# Patient Record
Sex: Male | Born: 1985 | Race: White | Hispanic: No | Marital: Married | Smoking: Never smoker
Health system: Southern US, Community
[De-identification: ages and names within clinical notes are randomized; demographics above are authoritative.]

## PROBLEM LIST (undated history)

## (undated) DIAGNOSIS — N200 Calculus of kidney: Secondary | ICD-10-CM

## (undated) HISTORY — PX: APPENDECTOMY: SHX54

---

## 2020-02-21 ENCOUNTER — Other Ambulatory Visit: Payer: Self-pay

## 2020-02-21 ENCOUNTER — Encounter (HOSPITAL_COMMUNITY): Payer: Self-pay | Admitting: Emergency Medicine

## 2020-02-21 ENCOUNTER — Emergency Department (HOSPITAL_COMMUNITY)
Admission: EM | Admit: 2020-02-21 | Discharge: 2020-02-21 | Disposition: A | Discharge status: Home / Self Care | Payer: No Typology Code available for payment source | Source: Home / Self Care | Attending: Emergency Medicine | Admitting: Emergency Medicine

## 2020-02-21 ENCOUNTER — Emergency Department (HOSPITAL_COMMUNITY): Payer: No Typology Code available for payment source

## 2020-02-21 ENCOUNTER — Emergency Department (HOSPITAL_COMMUNITY)
Admission: EM | Admit: 2020-02-21 | Discharge: 2020-02-21 | Disposition: A | Discharge status: Home / Self Care | Payer: No Typology Code available for payment source | Attending: Emergency Medicine | Admitting: Emergency Medicine

## 2020-02-21 DIAGNOSIS — R1031 Right lower quadrant pain: Secondary | ICD-10-CM | POA: Diagnosis present

## 2020-02-21 DIAGNOSIS — R112 Nausea with vomiting, unspecified: Secondary | ICD-10-CM | POA: Insufficient documentation

## 2020-02-21 DIAGNOSIS — N23 Unspecified renal colic: Secondary | ICD-10-CM | POA: Insufficient documentation

## 2020-02-21 DIAGNOSIS — N201 Calculus of ureter: Secondary | ICD-10-CM

## 2020-02-21 LAB — COMPREHENSIVE METABOLIC PANEL
ALT: 49 U/L — ABNORMAL HIGH (ref 0–44)
AST: 33 U/L (ref 15–41)
Albumin: 4.4 g/dL (ref 3.5–5.0)
Alkaline Phosphatase: 81 U/L (ref 38–126)
Anion gap: 6 (ref 5–15)
BUN: 9 mg/dL (ref 6–20)
CO2: 25 mmol/L (ref 22–32)
Calcium: 9.1 mg/dL (ref 8.9–10.3)
Chloride: 108 mmol/L (ref 98–111)
Creatinine, Ser: 0.89 mg/dL (ref 0.61–1.24)
GFR calc Af Amer: >60 mL/min (ref >60)
GFR calc non Af Amer: >60 mL/min (ref >60)
Glucose, Bld: 105 mg/dL — ABNORMAL HIGH (ref 70–99)
Potassium: 4 mmol/L (ref 3.5–5.1)
Sodium: 139 mmol/L (ref 135–145)
Total Bilirubin: 0.9 mg/dL (ref 0.3–1.2)
Total Protein: 7.2 g/dL (ref 6.5–8.1)

## 2020-02-21 LAB — CBC WITH DIFFERENTIAL/PLATELET
Abs Immature Granulocytes: 0.02 10*3/uL (ref 0.00–0.07)
Basophils Absolute: 0 10*3/uL (ref 0.0–0.1)
Basophils Relative: 1 %
Eosinophils Absolute: 0.2 10*3/uL (ref 0.0–0.5)
Eosinophils Relative: 4 %
HCT: 44 % (ref 39.0–52.0)
Hemoglobin: 15.6 g/dL (ref 13.0–17.0)
Immature Granulocytes: 0 %
Lymphocytes Relative: 30 %
Lymphs Abs: 2 10*3/uL (ref 0.7–4.0)
MCH: 31.3 pg (ref 26.0–34.0)
MCHC: 35.5 g/dL (ref 30.0–36.0)
MCV: 88.4 fL (ref 80.0–100.0)
Monocytes Absolute: 0.6 10*3/uL (ref 0.1–1.0)
Monocytes Relative: 9 %
Neutro Abs: 3.7 10*3/uL (ref 1.7–7.7)
Neutrophils Relative %: 56 %
Platelets: 258 10*3/uL (ref 150–400)
RBC: 4.98 MIL/uL (ref 4.22–5.81)
RDW: 12.2 % (ref 11.5–15.5)
WBC: 6.5 10*3/uL (ref 4.0–10.5)
nRBC: 0 % (ref 0.0–0.2)

## 2020-02-21 LAB — URINALYSIS, ROUTINE W REFLEX MICROSCOPIC
Bacteria, UA: NONE SEEN
Bilirubin Urine: NEGATIVE
Glucose, UA: NEGATIVE mg/dL
Ketones, ur: NEGATIVE mg/dL
Leukocytes,Ua: NEGATIVE
Nitrite: NEGATIVE
Protein, ur: 30 mg/dL — AB
RBC / HPF: >50 RBC/hpf — ABNORMAL HIGH (ref 0–5)
Specific Gravity, Urine: 1.018 (ref 1.005–1.030)
pH: 6 (ref 5.0–8.0)

## 2020-02-21 MED ORDER — HYDROMORPHONE HCL 1 MG/ML IJ SOLN
1.0000 mg | Freq: Once | INTRAMUSCULAR | Status: AC
  Administered 2020-02-21: 16:00:00 1 mg via INTRAVENOUS
  Filled 2020-02-21: qty 1

## 2020-02-21 MED ORDER — SODIUM CHLORIDE 0.9 % IV BOLUS
1000.0000 mL | Freq: Once | INTRAVENOUS | Status: AC
  Administered 2020-02-21: 1000 mL via INTRAVENOUS

## 2020-02-21 MED ORDER — SODIUM CHLORIDE 0.9 % IV SOLN: INTRAVENOUS | Status: DC

## 2020-02-21 MED ORDER — ONDANSETRON 4 MG PO TBDP: 4.0000 mg | ORAL_TABLET | Freq: Three times a day (TID) | ORAL | 0 refills | Status: DC | PRN

## 2020-02-21 MED ORDER — HYDROCODONE-ACETAMINOPHEN 5-325 MG PO TABS: 1.0000 | ORAL_TABLET | ORAL | 0 refills | Status: DC | PRN

## 2020-02-21 MED ORDER — ONDANSETRON HCL 4 MG/2ML IJ SOLN
4.0000 mg | Freq: Once | INTRAMUSCULAR | Status: AC
  Administered 2020-02-21: 16:00:00 4 mg via INTRAVENOUS
  Filled 2020-02-21: qty 2

## 2020-02-21 MED ORDER — HYDROMORPHONE HCL 1 MG/ML IJ SOLN
1.0000 mg | Freq: Once | INTRAMUSCULAR | Status: AC
  Administered 2020-02-21: 19:00:00 1 mg via INTRAVENOUS
  Filled 2020-02-21: qty 1

## 2020-02-21 MED ORDER — ONDANSETRON HCL 4 MG/2ML IJ SOLN
4.0000 mg | Freq: Once | INTRAMUSCULAR | Status: AC
  Administered 2020-02-21: 4 mg via INTRAVENOUS
  Filled 2020-02-21: qty 2

## 2020-02-21 MED ORDER — ONDANSETRON HCL 4 MG/2ML IJ SOLN
4.0000 mg | Freq: Once | INTRAMUSCULAR | Status: DC
  Filled 2020-02-21: qty 2

## 2020-02-21 MED ORDER — KETOROLAC TROMETHAMINE 15 MG/ML IJ SOLN
15.0000 mg | Freq: Once | INTRAMUSCULAR | Status: AC
  Administered 2020-02-21: 15 mg via INTRAVENOUS
  Filled 2020-02-21: qty 1

## 2020-02-21 MED ORDER — HYDROMORPHONE HCL 1 MG/ML IJ SOLN
1.0000 mg | Freq: Once | INTRAMUSCULAR | Status: AC
  Administered 2020-02-21: 08:00:00 1 mg via INTRAVENOUS
  Filled 2020-02-21: qty 1

## 2020-02-21 NOTE — Discharge Instructions (Addendum)
If you develop fever, uncontrolled vomiting, uncontrolled pain, or any other new/concerning symptoms then return to the ER for evaluation.   Take ibuprofen for your pain.  You may also take Tylenol.  If the pain is still uncontrolled, then use the hydrocodone for pain.  Do not drive or operate heavy machinery while taking this medication.

## 2020-02-21 NOTE — ED Triage Notes (Signed)
Return d/t to increasing emesis.  Pain to right flank and radiating to front.  C/o back pain when urinating.

## 2020-02-21 NOTE — ED Notes (Signed)
Pt wants to hold off on Zofran for now.

## 2020-02-21 NOTE — ED Provider Notes (Signed)
[facility] EMERGENCY DEPARTMENT Provider Note   CSN: [id number] Arrival date & time: 02/21/20  1514     History Chief Complaint  Patient presents with  . Emesis    Roger Thompson is a 34 y.o. male.  Patient seen this morning for acute onset of right flank pain.  Patient's clinical history very consistent with kidney stone.  Patient opted not to have CT scan.  But did have labs done.  Patient discharged home on Zofran and hydrocodone.  But patient with persistent vomiting and worsening pain so came back.  Remote history of kidney stone.  No vomiting of blood.  No fevers.  Review of labs from this morning patient's renal function was normal.  Urinalysis had red blood cells in it.  Urinalysis had a few white blood cells.  But no bacteria.  CBC without a leukocytosis.        History reviewed. No pertinent past medical history.  There are no problems to display for this patient.   Past Surgical History:  Procedure Laterality Date  . APPENDECTOMY         History reviewed. No pertinent family history.  Social History   Tobacco Use  . Smoking status: Never Smoker  . Smokeless tobacco: Never Used  Substance Use Topics  . Alcohol use: Never  . Drug use: Never    Home Medications Prior to Admission medications   Medication Sig Start Date End Date Taking? Authorizing Provider  diphenhydrAMINE (BENADRYL) 25 mg capsule Take 25 mg by mouth every 6 (six) hours as needed for allergies.   Yes [provider]  levocetirizine (XYZAL) 5 MG tablet Take 5 mg by mouth every evening.   Yes [provider]  HYDROcodone-acetaminophen (NORCO) 5-325 MG tablet Take 1 tablet by mouth every 4 (four) hours as needed for severe pain. 02/21/20   Pricilla Loveless, MD  ondansetron (ZOFRAN ODT) 4 MG disintegrating tablet Take 1 tablet (4 mg total) by mouth every 8 (eight) hours as needed for nausea or vomiting. 02/21/20   Pricilla Loveless, MD    Allergies    Patient has no known  allergies.  Review of Systems   Review of Systems  Constitutional: Negative for chills and fever.  HENT: Negative for congestion, rhinorrhea and sore throat.   Eyes: Negative for visual disturbance.  Respiratory: Negative for cough and shortness of breath.   Cardiovascular: Negative for chest pain and leg swelling.  Gastrointestinal: Positive for nausea and vomiting. Negative for abdominal pain and diarrhea.  Genitourinary: Positive for flank pain and hematuria. Negative for dysuria.  Musculoskeletal: Negative for back pain and neck pain.  Skin: Negative for rash.  Neurological: Negative for dizziness, light-headedness and headaches.  Hematological: Does not bruise/bleed easily.  Psychiatric/Behavioral: Negative for confusion.    Physical Exam Updated Vital Signs BP 132/70   Pulse 70   Temp 98.2 F (36.8 C) (Oral)   Resp 16   Ht 1.88 m (6\' 2" )   Wt 111.1 kg   SpO2 98%   BMI 31.46 kg/m   Physical Exam Vitals and nursing note reviewed.  Constitutional:      Appearance: He is well-developed.  HENT:     Head: Normocephalic and atraumatic.  Eyes:     Extraocular Movements: Extraocular movements intact.     Conjunctiva/sclera: Conjunctivae normal.     Pupils: Pupils are equal, round, and reactive to light.  Cardiovascular:     Rate and Rhythm: Normal rate and regular rhythm.     Pulses:  Normal pulses.     Heart sounds: Normal heart sounds. No murmur.  Pulmonary:     Effort: Pulmonary effort is normal. No respiratory distress.     Breath sounds: Normal breath sounds.  Abdominal:     General: There is no distension.     Palpations: Abdomen is soft.     Tenderness: There is no abdominal tenderness.  Musculoskeletal:        General: Normal range of motion.     Cervical back: Neck supple.  Skin:    General: Skin is warm and dry.     Capillary Refill: Capillary refill takes less than 2 seconds.  Neurological:     General: No focal deficit present.     Mental Status: He  is alert and oriented to person, place, and time.     ED Results / Procedures / Treatments   Labs (all labs ordered are listed, but only abnormal results are displayed) Labs Reviewed - No data to display  EKG None  Radiology CT Renal Stone Study  Result Date: 02/21/2020 CLINICAL DATA:  Right-sided flank pain EXAM: CT ABDOMEN AND PELVIS WITHOUT CONTRAST TECHNIQUE: Multidetector CT imaging of the abdomen and pelvis was performed following the standard protocol without IV contrast. COMPARISON:  None. FINDINGS: Lower chest: Lung bases demonstrate some mild dependent atelectatic changes. No focal infiltrate or effusion is seen. Hepatobiliary: No focal liver abnormality is seen. No gallstones, gallbladder wall thickening, or biliary dilatation. Pancreas: Unremarkable. No pancreatic ductal dilatation or surrounding inflammatory changes. Spleen: Normal in size without focal abnormality. Adrenals/Urinary Tract: Adrenal glands are within normal limits bilaterally. The left kidney is well visualize without renal calculi. No obstructive changes are noted. The right kidney demonstrates no calculi although moderate hydronephrosis is noted secondary to a distal right ureteral stone just proximal to the UVJ. This measures approximately 2 mm in dimension and is best visualized on image number 84 of series 2. The bladder is well distended. Stomach/Bowel: Changes consistent with prior appendectomy are noted. No obstructive or inflammatory changes of the large or small bowel are seen. The stomach is within normal limits. Vascular/Lymphatic: No significant vascular findings are present. No enlarged abdominal or pelvic lymph nodes. Reproductive: Prostate is unremarkable. Other: No abdominal wall hernia or abnormality. No abdominopelvic ascites. Musculoskeletal: No acute or significant osseous findings. IMPRESSION: Distal right ureteral stone measuring 2 mm with moderate to severe hydronephrosis. Electronically Signed   By:  Inez Catalina M.D.   On: 02/21/2020 17:22   Results for orders placed or performed during the hospital encounter of 02/21/20  Comprehensive metabolic panel  Result Value Ref Range   Sodium 139 135 - 145 mmol/L   Potassium 4.0 3.5 - 5.1 mmol/L   Chloride 108 98 - 111 mmol/L   CO2 25 22 - 32 mmol/L   Glucose, Bld 105 (H) 70 - 99 mg/dL   BUN 9 6 - 20 mg/dL   Creatinine, Ser 0.89 0.61 - 1.24 mg/dL   Calcium 9.1 8.9 - 10.3 mg/dL   Total Protein 7.2 6.5 - 8.1 g/dL   Albumin 4.4 3.5 - 5.0 g/dL   AST 33 15 - 41 U/L   ALT 49 (H) 0 - 44 U/L   Alkaline Phosphatase 81 38 - 126 U/L   Total Bilirubin 0.9 0.3 - 1.2 mg/dL   GFR calc non Af Amer >60 >60 mL/min   GFR calc Af Amer >60 >60 mL/min   Anion gap 6 5 - 15  Urinalysis, Routine w reflex  microscopic  Result Value Ref Range   Color, Urine YELLOW YELLOW   APPearance HAZY (A) CLEAR   Specific Gravity, Urine 1.018 1.005 - 1.030   pH 6.0 5.0 - 8.0   Glucose, UA NEGATIVE NEGATIVE mg/dL   Hgb urine dipstick LARGE (A) NEGATIVE   Bilirubin Urine NEGATIVE NEGATIVE   Ketones, ur NEGATIVE NEGATIVE mg/dL   Protein, ur 30 (A) NEGATIVE mg/dL   Nitrite NEGATIVE NEGATIVE   Leukocytes,Ua NEGATIVE NEGATIVE   RBC / HPF >50 (H) 0 - 5 RBC/hpf   WBC, UA 11-20 0 - 5 WBC/hpf   Bacteria, UA NONE SEEN NONE SEEN   Mucus PRESENT    Uric Acid Crys, UA PRESENT   CBC with Differential  Result Value Ref Range   WBC 6.5 4.0 - 10.5 K/uL   RBC 4.98 4.22 - 5.81 MIL/uL   Hemoglobin 15.6 13.0 - 17.0 g/dL   HCT 74.2 59.5 - 63.8 %   MCV 88.4 80.0 - 100.0 fL   MCH 31.3 26.0 - 34.0 pg   MCHC 35.5 30.0 - 36.0 g/dL   RDW 75.6 43.3 - 29.5 %   Platelets 258 150 - 400 K/uL   nRBC 0.0 0.0 - 0.2 %   Neutrophils Relative % 56 %   Neutro Abs 3.7 1.7 - 7.7 K/uL   Lymphocytes Relative 30 %   Lymphs Abs 2.0 0.7 - 4.0 K/uL   Monocytes Relative 9 %   Monocytes Absolute 0.6 0.1 - 1.0 K/uL   Eosinophils Relative 4 %   Eosinophils Absolute 0.2 0.0 - 0.5 K/uL   Basophils  Relative 1 %   Basophils Absolute 0.0 0.0 - 0.1 K/uL   Immature Granulocytes 0 %   Abs Immature Granulocytes 0.02 0.00 - 0.07 K/uL    Procedures Procedures (including critical care time)  Medications Ordered in ED Medications  0.9 %  sodium chloride infusion (has no administration in time range)  HYDROmorphone (DILAUDID) injection 1 mg (has no administration in time range)  ondansetron (ZOFRAN) injection 4 mg (has no administration in time range)  sodium chloride 0.9 % bolus 1,000 mL (1,000 mLs Intravenous New Bag/Given 02/21/20 1612)  HYDROmorphone (DILAUDID) injection 1 mg (1 mg Intravenous Given 02/21/20 1611)  ondansetron (ZOFRAN) injection 4 mg (4 mg Intravenous Given 02/21/20 1611)    ED Course  I have reviewed the triage vital signs and the nursing notes.  Pertinent labs & imaging results that were available during my care of the patient were reviewed by me and considered in my medical decision making (see chart for details).    MDM Rules/Calculators/A&P                     Patient improved here with IV hydromorphone and Zofran.  CT scan can for kidney stone showed a 2 mm right ureteral stone close to the bladder junction.  Patient's pain improved.  Should pass the stone overnight.  Referral to urology given.  Patient already has prescriptions for pain medicine and antinausea medicine.  Patient overall improved.  Final Clinical Impression(s) / ED Diagnoses Final diagnoses:  Right ureteral stone    Rx / DC Orders ED Discharge Orders    None       Vanetta Mulders, MD 02/21/20 [phone]

## 2020-02-21 NOTE — ED Triage Notes (Signed)
C/o right flank pain, rating pain 06/24/09.  Has passed stones in the passed.  C/o nausea and vomiting.

## 2020-02-21 NOTE — ED Provider Notes (Signed)
[facility] EMERGENCY DEPARTMENT Provider Note   CSN: [id number] Arrival date & time: 02/21/20  0747     History Chief Complaint  Patient presents with  . Flank Pain    right    Roger Thompson is a 34 y.o. male.  HPI 34 year old male presents with right-sided flank pain and an episode of vomiting.  Started all of a sudden this morning.  Seem to go away but then came back so he came to the ER for evaluation.  Pain is more moderate right now.  He has not taken anything for the pain.  It seems to shoot to his right side of his abdomen.  He had a kidney stone many years ago that was evaluated in [place] with a CT scan and states he was able to pass this on his own.  This feels similar.  He has not had any fevers.  He has not urinated yet today but previously had no dysuria or hematuria.  History reviewed. No pertinent past medical history.  There are no problems to display for this patient.   Past Surgical History:  Procedure Laterality Date  . APPENDECTOMY         No family history on file.  Social History   Tobacco Use  . Smoking status: Never Smoker  . Smokeless tobacco: Never Used  Substance Use Topics  . Alcohol use: Never  . Drug use: Never    Home Medications Prior to Admission medications   Medication Sig Start Date End Date Taking? Authorizing Provider  diphenhydrAMINE (BENADRYL) 25 mg capsule Take 25 mg by mouth every 6 (six) hours as needed for allergies.   Yes [provider]  levocetirizine (XYZAL) 5 MG tablet Take 5 mg by mouth every evening.   Yes [provider]  HYDROcodone-acetaminophen (NORCO) 5-325 MG tablet Take 1 tablet by mouth every 4 (four) hours as needed for severe pain. 02/21/20   Sherwood Gambler, MD    Allergies    Patient has no known allergies.  Review of Systems   Review of Systems  Constitutional: Negative for fever.  Gastrointestinal: Positive for abdominal pain, nausea and vomiting.  Genitourinary: Positive  for flank pain. Negative for dysuria and hematuria.  All other systems reviewed and are negative.   Physical Exam Updated Vital Signs BP 114/66   Pulse 63   Temp 98.4 F (36.9 C) (Oral)   Resp 16   Ht 6\' 2"  (1.88 m)   Wt 111.1 kg   SpO2 95%   BMI 31.46 kg/m   Physical Exam Vitals and nursing note reviewed.  Constitutional:      General: He is not in acute distress.    Appearance: He is well-developed. He is not ill-appearing or diaphoretic.  HENT:     Head: Normocephalic and atraumatic.     Right Ear: External ear normal.     Left Ear: External ear normal.     Nose: Nose normal.  Eyes:     General:        Right eye: No discharge.        Left eye: No discharge.  Cardiovascular:     Rate and Rhythm: Normal rate and regular rhythm.     Heart sounds: Normal heart sounds.  Pulmonary:     Effort: Pulmonary effort is normal.     Breath sounds: Normal breath sounds.  Abdominal:     General: There is no distension.     Palpations: Abdomen is soft.  Tenderness: There is no abdominal tenderness. There is right CVA tenderness (mild).  Musculoskeletal:     Cervical back: Neck supple.  Skin:    General: Skin is warm and dry.  Neurological:     Mental Status: He is alert.  Psychiatric:        Mood and Affect: Mood is not anxious.     ED Results / Procedures / Treatments   Labs (all labs ordered are listed, but only abnormal results are displayed) Labs Reviewed  COMPREHENSIVE METABOLIC PANEL - Abnormal; Notable for the following components:      Result Value   Glucose, Bld 105 (*)    ALT 49 (*)    All other components within normal limits  URINALYSIS, ROUTINE W REFLEX MICROSCOPIC - Abnormal; Notable for the following components:   APPearance HAZY (*)    Hgb urine dipstick LARGE (*)    Protein, ur 30 (*)    RBC / HPF >50 (*)    All other components within normal limits  CBC WITH DIFFERENTIAL/PLATELET    EKG None  Radiology No results  found.  Procedures Procedures (including critical care time)  Medications Ordered in ED Medications  ondansetron (ZOFRAN) injection 4 mg (4 mg Intravenous Refused 02/21/20 1028)  sodium chloride 0.9 % bolus 1,000 mL (0 mLs Intravenous Stopped 02/21/20 0933)  ketorolac (TORADOL) 15 MG/ML injection 15 mg (15 mg Intravenous Given 02/21/20 0823)  HYDROmorphone (DILAUDID) injection 1 mg (1 mg Intravenous Given 02/21/20 0825)    ED Course  I have reviewed the triage vital signs and the nursing notes.  Pertinent labs & imaging results that were available during my care of the patient were reviewed by me and considered in my medical decision making (see chart for details).    MDM Rules/Calculators/A&P                      Presentation is consistent with ureteral colic.  However his pain is quite well controlled and his labs which have been reviewed are unrevealing except for hematuria.  My suspicion that he has a ureteral stone is pretty high.  He is already had his appendix out.  Highly doubt acute emergent abdominal condition.  No abdominal tenderness.  At this point, I had shared decision making decision with patient and we decided to hold off on CT given highly likely diagnosis of stone and since his pain is controlled he is probably likely to pass it.  We discussed we can get it later if his symptoms worsen.  We will have him follow-up with urology.  Will give pain control. Final Clinical Impression(s) / ED Diagnoses Final diagnoses:  Ureteral colic    Rx / DC Orders ED Discharge Orders         Ordered    HYDROcodone-acetaminophen (NORCO) 5-325 MG tablet  Every 4 hours PRN     02/21/20 1023           Pricilla Loveless, MD 02/21/20 1044

## 2020-02-21 NOTE — Discharge Instructions (Addendum)
CT scan shows evidence of a distal ureteral stone about ready to enter the bladder from the right ureter.  Should pass it sometime tonight.  Make an appointment follow-up with urology.  Return for any new or worse symptoms.  Take the Zofran dissolvable help prevent vomiting.  Take your pain medication as needed.

## 2020-02-26 ENCOUNTER — Encounter (HOSPITAL_COMMUNITY)
Admission: RE | Disposition: A | Discharge status: Home / Self Care | Payer: Self-pay | Source: Ambulatory Visit | Attending: Urology

## 2020-02-26 ENCOUNTER — Other Ambulatory Visit: Payer: Self-pay

## 2020-02-26 ENCOUNTER — Ambulatory Visit (HOSPITAL_COMMUNITY): Payer: No Typology Code available for payment source | Admitting: Anesthesiology

## 2020-02-26 ENCOUNTER — Ambulatory Visit (HOSPITAL_COMMUNITY): Payer: No Typology Code available for payment source

## 2020-02-26 ENCOUNTER — Encounter (HOSPITAL_COMMUNITY): Payer: Self-pay | Admitting: *Deleted

## 2020-02-26 ENCOUNTER — Ambulatory Visit (HOSPITAL_COMMUNITY)
Admission: RE | Admit: 2020-02-26 | Discharge: 2020-02-26 | Disposition: A | Discharge status: Home / Self Care | Payer: No Typology Code available for payment source | Source: Ambulatory Visit | Attending: Urology | Admitting: Urology

## 2020-02-26 ENCOUNTER — Emergency Department (HOSPITAL_COMMUNITY)
Admission: EM | Admit: 2020-02-26 | Discharge: 2020-02-26 | Disposition: A | Discharge status: Home / Self Care | Payer: No Typology Code available for payment source | Source: Home / Self Care | Attending: Emergency Medicine | Admitting: Emergency Medicine

## 2020-02-26 ENCOUNTER — Encounter (HOSPITAL_COMMUNITY): Payer: Self-pay | Admitting: Urology

## 2020-02-26 DIAGNOSIS — Z791 Long term (current) use of non-steroidal anti-inflammatories (NSAID): Secondary | ICD-10-CM | POA: Insufficient documentation

## 2020-02-26 DIAGNOSIS — N201 Calculus of ureter: Secondary | ICD-10-CM | POA: Insufficient documentation

## 2020-02-26 DIAGNOSIS — Q6210 Congenital occlusion of ureter, unspecified: Secondary | ICD-10-CM | POA: Insufficient documentation

## 2020-02-26 DIAGNOSIS — Z79899 Other long term (current) drug therapy: Secondary | ICD-10-CM | POA: Insufficient documentation

## 2020-02-26 DIAGNOSIS — Z20822 Contact with and (suspected) exposure to covid-19: Secondary | ICD-10-CM | POA: Diagnosis not present

## 2020-02-26 DIAGNOSIS — N23 Unspecified renal colic: Secondary | ICD-10-CM | POA: Insufficient documentation

## 2020-02-26 HISTORY — PX: CYSTOSCOPY WITH RETROGRADE PYELOGRAM, URETEROSCOPY AND STENT PLACEMENT: SHX5789

## 2020-02-26 HISTORY — DX: Calculus of kidney: N20.0

## 2020-02-26 LAB — RESPIRATORY PANEL BY RT PCR (FLU A&B, COVID)
Influenza A by PCR: NEGATIVE
Influenza B by PCR: NEGATIVE
SARS Coronavirus 2 by RT PCR: NEGATIVE

## 2020-02-26 SURGERY — CYSTOURETEROSCOPY, WITH RETROGRADE PYELOGRAM AND STENT INSERTION
Anesthesia: General | Site: Ureter | Laterality: Right

## 2020-02-26 MED ORDER — SODIUM CHLORIDE 0.9 % IR SOLN
Status: DC | PRN
  Administered 2020-02-26: 3000 mL

## 2020-02-26 MED ORDER — HYDROMORPHONE HCL 1 MG/ML IJ SOLN
1.0000 mg | Freq: Once | INTRAMUSCULAR | Status: AC
  Administered 2020-02-26: 04:00:00 1 mg via INTRAVENOUS
  Filled 2020-02-26: qty 1

## 2020-02-26 MED ORDER — SODIUM CHLORIDE 0.9% FLUSH: 3.0000 mL | Freq: Two times a day (BID) | INTRAVENOUS | Status: DC

## 2020-02-26 MED ORDER — FENTANYL CITRATE (PF) 100 MCG/2ML IJ SOLN: 25.0000 ug | INTRAMUSCULAR | Status: DC | PRN

## 2020-02-26 MED ORDER — MIDAZOLAM HCL 2 MG/2ML IJ SOLN
INTRAMUSCULAR | Status: AC
  Filled 2020-02-26: qty 2

## 2020-02-26 MED ORDER — KETOROLAC TROMETHAMINE 30 MG/ML IJ SOLN
30.0000 mg | Freq: Once | INTRAMUSCULAR | Status: AC
  Administered 2020-02-26: 03:00:00 30 mg via INTRAVENOUS
  Filled 2020-02-26: qty 1

## 2020-02-26 MED ORDER — PHENAZOPYRIDINE HCL 200 MG PO TABS: 200.0000 mg | ORAL_TABLET | Freq: Three times a day (TID) | ORAL | 1 refills | Status: DC | PRN

## 2020-02-26 MED ORDER — ONDANSETRON HCL 4 MG/2ML IJ SOLN
4.0000 mg | INTRAMUSCULAR | Status: AC
  Administered 2020-02-26: 4 mg via INTRAVENOUS

## 2020-02-26 MED ORDER — KETOROLAC TROMETHAMINE 30 MG/ML IJ SOLN
INTRAMUSCULAR | Status: AC
  Filled 2020-02-26: qty 1

## 2020-02-26 MED ORDER — LIDOCAINE 2% (20 MG/ML) 5 ML SYRINGE
INTRAMUSCULAR | Status: AC
  Filled 2020-02-26: qty 5

## 2020-02-26 MED ORDER — KETOROLAC TROMETHAMINE 30 MG/ML IJ SOLN
30.0000 mg | Freq: Once | INTRAMUSCULAR | Status: AC | PRN
  Administered 2020-02-26: 21:00:00 30 mg via INTRAVENOUS

## 2020-02-26 MED ORDER — MIDAZOLAM HCL 2 MG/2ML IJ SOLN
INTRAMUSCULAR | Status: DC | PRN
  Administered 2020-02-26: 2 mg via INTRAVENOUS

## 2020-02-26 MED ORDER — ONDANSETRON HCL 4 MG/2ML IJ SOLN
INTRAMUSCULAR | Status: DC | PRN
  Administered 2020-02-26: 4 mg via INTRAVENOUS

## 2020-02-26 MED ORDER — FENTANYL CITRATE (PF) 100 MCG/2ML IJ SOLN
INTRAMUSCULAR | Status: AC
  Filled 2020-02-26: qty 2

## 2020-02-26 MED ORDER — LIDOCAINE 2% (20 MG/ML) 5 ML SYRINGE
INTRAMUSCULAR | Status: DC | PRN
  Administered 2020-02-26: 100 mg via INTRAVENOUS

## 2020-02-26 MED ORDER — PROPOFOL 10 MG/ML IV BOLUS
INTRAVENOUS | Status: AC
  Filled 2020-02-26: qty 20

## 2020-02-26 MED ORDER — OXYCODONE-ACETAMINOPHEN 5-325 MG PO TABS: 1.0000 | ORAL_TABLET | ORAL | 0 refills | Status: DC | PRN

## 2020-02-26 MED ORDER — OXYCODONE-ACETAMINOPHEN 5-325 MG PO TABS: 2.0000 | ORAL_TABLET | ORAL | 0 refills | Status: DC | PRN

## 2020-02-26 MED ORDER — CEFAZOLIN (ANCEF) 1 G IV SOLR
2.0000 g | INTRAVENOUS | Status: DC
  Filled 2020-02-26: qty 2

## 2020-02-26 MED ORDER — HYDROMORPHONE HCL 1 MG/ML IJ SOLN
1.0000 mg | Freq: Once | INTRAMUSCULAR | Status: AC
  Administered 2020-02-26: 03:00:00 1 mg via INTRAVENOUS
  Filled 2020-02-26: qty 1

## 2020-02-26 MED ORDER — ONDANSETRON HCL 4 MG/2ML IJ SOLN
4.0000 mg | Freq: Once | INTRAMUSCULAR | Status: AC
  Administered 2020-02-26: 02:00:00 4 mg via INTRAVENOUS
  Filled 2020-02-26: qty 2

## 2020-02-26 MED ORDER — OXYCODONE HCL 5 MG PO TABS: 5.0000 mg | ORAL_TABLET | Freq: Once | ORAL | Status: DC | PRN

## 2020-02-26 MED ORDER — DEXAMETHASONE SODIUM PHOSPHATE 10 MG/ML IJ SOLN
INTRAMUSCULAR | Status: DC | PRN
  Administered 2020-02-26: 10 mg via INTRAVENOUS

## 2020-02-26 MED ORDER — CEFAZOLIN SODIUM-DEXTROSE 2-4 GM/100ML-% IV SOLN
2.0000 g | Freq: Once | INTRAVENOUS | Status: AC
  Administered 2020-02-26: 19:00:00 2 g via INTRAVENOUS
  Filled 2020-02-26: qty 100

## 2020-02-26 MED ORDER — FENTANYL CITRATE (PF) 100 MCG/2ML IJ SOLN
INTRAMUSCULAR | Status: DC | PRN
  Administered 2020-02-26: 50 ug via INTRAVENOUS
  Administered 2020-02-26: 100 ug via INTRAVENOUS
  Administered 2020-02-26: 50 ug via INTRAVENOUS

## 2020-02-26 MED ORDER — SODIUM CHLORIDE 0.9 % IV SOLN
INTRAVENOUS | Status: DC | PRN
  Administered 2020-02-26: 2 mL

## 2020-02-26 MED ORDER — LACTATED RINGERS IV SOLN: INTRAVENOUS | Status: DC

## 2020-02-26 MED ORDER — OXYCODONE HCL 5 MG/5ML PO SOLN: 5.0000 mg | Freq: Once | ORAL | Status: DC | PRN

## 2020-02-26 MED ORDER — PROPOFOL 10 MG/ML IV BOLUS
INTRAVENOUS | Status: DC | PRN
  Administered 2020-02-26: 200 mg via INTRAVENOUS

## 2020-02-26 MED ORDER — ONDANSETRON HCL 4 MG/2ML IJ SOLN
INTRAMUSCULAR | Status: AC
  Filled 2020-02-26: qty 2

## 2020-02-26 SURGICAL SUPPLY — 24 items
BAG URO CATCHER STRL LF (MISCELLANEOUS) ×3 IMPLANT
BASKET STONE NCOMPASS (UROLOGICAL SUPPLIES) IMPLANT
CATH URET 5FR 28IN OPEN ENDED (CATHETERS) IMPLANT
CATH URET DUAL LUMEN 6-10FR 50 (CATHETERS) IMPLANT
CLOTH BEACON ORANGE TIMEOUT ST (SAFETY) ×3 IMPLANT
EXTRACTOR STONE NITINOL NGAGE (UROLOGICAL SUPPLIES) ×3 IMPLANT
FIBER LASER FLEXIVA 1000 (UROLOGICAL SUPPLIES) IMPLANT
FIBER LASER FLEXIVA 365 (UROLOGICAL SUPPLIES) IMPLANT
FIBER LASER FLEXIVA 550 (UROLOGICAL SUPPLIES) IMPLANT
FIBER LASER TRAC TIP (UROLOGICAL SUPPLIES) IMPLANT
GLOVE SURG SS PI 8.0 STRL IVOR (GLOVE) ×3 IMPLANT
GOWN STRL REUS W/TWL XL LVL3 (GOWN DISPOSABLE) ×3 IMPLANT
GUIDEWIRE STR DUAL SENSOR (WIRE) ×3 IMPLANT
IV NS IRRIG 3000ML ARTHROMATIC (IV SOLUTION) ×3 IMPLANT
KIT BALLN UROMAX 15FX4 (MISCELLANEOUS) ×1 IMPLANT
KIT BALLN UROMAX 26 75X4 (MISCELLANEOUS) ×2
KIT TURNOVER KIT A (KITS) IMPLANT
MANIFOLD NEPTUNE II (INSTRUMENTS) ×3 IMPLANT
PACK CYSTO (CUSTOM PROCEDURE TRAY) ×3 IMPLANT
SHEATH URETERAL 12FRX35CM (MISCELLANEOUS) IMPLANT
STENT URET 6FRX26 CONTOUR (STENTS) ×3 IMPLANT
TUBING CONNECTING 10 (TUBING) ×2 IMPLANT
TUBING CONNECTING 10' (TUBING) ×1
TUBING UROLOGY SET (TUBING) ×3 IMPLANT

## 2020-02-26 NOTE — Op Note (Signed)
Procedure: 1.  Cystoscopy with right retrograde pyelogram and interpretation. 2.  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 3.  Right ureteroscopic stone extraction. 4.  Insertion of right double-J stent. 5.  Application of fluoroscopy.  Preop diagnosis: Right distal ureteral stone.  Postop diagnosis: Same with narrowing of the right intramural ureter.  Surgeon: Dr. Irine Seal.  Anesthesia: General.  Specimen: Stone.  Drains: 6 French by 26 cm right contour double-J stent with tether.  EBL: None.  Complications: None.  Indications: The patient is a 34 year old male with a small right distal ureteral stone is had intractable pain with nausea and vomiting and is elected to undergo ureteroscopy for treatment.  Procedure: He was given 2 g of Ancef.  A general anesthetic was induced.  He was placed in lithotomy position and fitted with PAS hose.  His perineum and genitalia were prepped with Betadine solution he was draped in usual sterile fashion.  Cystoscopy was performed using a 23 [place] scope and 30 degree lens.  Examination revealed a normal urethra.  The external sphincter was intact.  The prostatic urethra short without obstruction.  The bladder wall had mild trabeculation without tumors, stones or inflammation.  Ureteral orifices were in the normal anatomic position.  Right ureteral orifice was cannulated with a 5 French opening catheter and contrast was instilled.  The right retrograde pyelogram demonstrated a very narrow intramural ureter with fusiform dilation above the intramural ureter with a filling defect consistent with the stone.  A sensor guidewire was then passed to the kidney through the opening catheter and the opening catheter was removed.  A 15 French by 4 cm high-pressure balloon was placed over the wire and dilated to 20 atm of pressure across the intramural ureter and 2 passes.  The balloon was then removed along with the cystoscope leaving the wire in  place.  The dual-lumen min 6.5 French semirigid ureteroscope was then passed alongside the wire.  The ureter was inspected from the meatus to the mid ureter and the stone was not seen.  An engage basket was then passed proximally and then open and retrieved to try to write back any stone that might of reflux but none were noted.  At this point the ureteroscope was backed out and no significant ureteral injury was identified but once again the stone was not identified.  Once the scope was backed into the bladder inspection of the trigone demonstrated the stone which appears to have come out upon retrieval of the balloon.  The stone was grasped with the engage basket through the ureteroscope and removed intact.  The cystoscope was then reinserted over the wire and a 6 [place] by 26 cm contour double-J stent was inserted to the kidney under fluoroscopic guidance.  The wire was removed, leaving a good coil in the kidney and a good coil in the bladder.  The the bladder was drained and the  cystoscope was removed leaving the stent string exiting the urethra.  He was taken down from lithotomy position, his anesthetic was reversed and he was moved recovery in stable condition.  There were no complications and he will be sent home with the stone to bring to the office.

## 2020-02-26 NOTE — Anesthesia Postprocedure Evaluation (Signed)
Anesthesia Post Note  Patient: Roger Thompson  Procedure(s) Performed: CYSTOSCOPY WITH RETROGRADE PYELOGRAM, URETEROSCOPY AND STENT PLACEMENT (Right Ureter)     Patient location during evaluation: PACU Anesthesia Type: General Level of consciousness: awake and alert Pain management: pain level controlled Vital Signs Assessment: post-procedure vital signs reviewed and stable Respiratory status: spontaneous breathing, nonlabored ventilation, respiratory function stable and patient connected to nasal cannula oxygen Cardiovascular status: blood pressure returned to baseline and stable Postop Assessment: no apparent nausea or vomiting Anesthetic complications: no    Last Vitals:  Vitals:   02/26/20 1947 02/26/20 2000  BP: (!) 142/80 (!) 147/80  Pulse: 68 (!) 57  Resp: 14 12  Temp: 36.8 C   SpO2: 99% 95%    Last Pain:  Vitals:   02/26/20 2000  TempSrc:   PainSc: 0-No pain                 Willine Schwalbe S

## 2020-02-26 NOTE — Anesthesia Preprocedure Evaluation (Signed)
Anesthesia Evaluation  Patient identified by MRN, date of birth, ID band Patient awake    Reviewed: Allergy & Precautions, NPO status , Patient's Chart, lab work & pertinent test results  Airway Mallampati: II  TM Distance: >3 FB Neck ROM: Full    Dental no notable dental hx.    Pulmonary neg pulmonary ROS,    Pulmonary exam normal breath sounds clear to auscultation       Cardiovascular negative cardio ROS Normal cardiovascular exam Rhythm:Regular Rate:Normal     Neuro/Psych negative neurological ROS  negative psych ROS   GI/Hepatic negative GI ROS, Neg liver ROS,   Endo/Other  negative endocrine ROS  Renal/GU negative Renal ROS  negative genitourinary   Musculoskeletal negative musculoskeletal ROS (+)   Abdominal   Peds negative pediatric ROS (+)  Hematology negative hematology ROS (+)   Anesthesia Other Findings   Reproductive/Obstetrics negative OB ROS                             Anesthesia Physical Anesthesia Plan  ASA: I  Anesthesia Plan: General   Post-op Pain Management:    Induction: Intravenous  PONV Risk Score and Plan: 2 and Ondansetron, Dexamethasone and Treatment may vary due to age or medical condition  Airway Management Planned: LMA  Additional Equipment:   Intra-op Plan:   Post-operative Plan: Extubation in OR  Informed Consent: I have reviewed the patients History and Physical, chart, labs and discussed the procedure including the risks, benefits and alternatives for the proposed anesthesia with the patient or authorized representative who has indicated his/her understanding and acceptance.     Dental advisory given  Plan Discussed with: CRNA and Surgeon  Anesthesia Plan Comments:         Anesthesia Quick Evaluation  

## 2020-02-26 NOTE — Discharge Instructions (Addendum)
Ureteral Stent Implantation, Care After This sheet gives you information about how to care for yourself after your procedure. Your health care provider may also give you more specific instructions. If you have problems or questions, contact your health care provider. What can I expect after the procedure? After the procedure, it is common to have:  Nausea.  Mild pain when you urinate. You may feel this pain in your lower back or lower abdomen. The pain should stop within a few minutes after you urinate. This may last for up to 1 week.  A small amount of blood in your urine for several days. Follow these instructions at home: Medicines  Take over-the-counter and prescription medicines only as told by your health care provider.  If you were prescribed an antibiotic medicine, take it as told by your health care provider. Do not stop taking the antibiotic even if you start to feel better.  Do not drive for 24 hours if you were given a sedative during your procedure.  Ask your health care provider if the medicine prescribed to you requires you to avoid driving or using heavy machinery. Activity  Rest as told by your health care provider.  Avoid sitting for a long time without moving. Get up to take short walks every 1-2 hours. This is important to improve blood flow and breathing. Ask for help if you feel weak or unsteady.  Return to your normal activities as told by your health care provider. Ask your health care provider what activities are safe for you. General instructions   Watch for any blood in your urine. Call your health care provider if the amount of blood in your urine increases.  If you have a catheter: ? Follow instructions from your health care provider about taking care of your catheter and collection bag. ? Do not take baths, swim, or use a hot tub until your health care provider approves. Ask your health care provider if you may take showers. You may only be allowed to  take sponge baths.  Drink enough fluid to keep your urine pale yellow.  Do not use any products that contain nicotine or tobacco, such as cigarettes, e-cigarettes, and chewing tobacco. These can delay healing after surgery. If you need help quitting, ask your health care provider.  Keep all follow-up visits as told by your health care provider. This is important. Contact a health care provider if:  You have pain that gets worse or does not get better with medicine, especially pain when you urinate.  You have difficulty urinating.  You feel nauseous or you vomit repeatedly during a period of more than 2 days after the procedure. Get help right away if:  Your urine is dark red or has blood clots in it.  You are leaking urine (have incontinence).  The end of the stent comes out of your urethra.  You cannot urinate.  You have sudden, sharp, or severe pain in your abdomen or lower back.  You have a fever.  You have swelling or pain in your legs.  You have difficulty breathing. Summary  After the procedure, it is common to have mild pain when you urinate that goes away within a few minutes after you urinate. This may last for up to 1 week.  Watch for any blood in your urine. Call your health care provider if the amount of blood in your urine increases.  Take over-the-counter and prescription medicines only as told by your health care provider.  Drink   enough fluid to keep your urine pale yellow.  You may remove the stent by pulling the attached string on Thursday morning and if you don't feel comfortable with this please contact the office to come in to have it done.  Please bring the stone to the office for analysis.  This information is not intended to replace advice given to you by your health care provider. Make sure you discuss any questions you have with your health care provider. Document Revised: 08/02/2018 Document Reviewed: 08/03/2018 Elsevier Patient Education  2020  [place] Anest[facility], Adult, Care After This sheet gives you information about how to care for yourself after your procedure. Your health care provider may also give you more specific instructions. If you have problems or questions, contact your health care provider. What can I expect after the procedure? After the procedure, the following side effects are common:  Pain or discomfort at the IV site.  Nausea.  Vomiting.  Sore throat.  Trouble concentrating.  Feeling cold or chills.  Weak or tired.  Sleepiness and fatigue.  Soreness and body aches. These side effects can affect parts of the body that were not involved in surgery. Follow these instructions at home:  For at least 24 hours after the procedure:  Have a responsible adult stay with you. It is important to have someone help care for you until you are awake and alert.  Rest as needed.  Do not: ? Participate in activities in which you could fall or become injured. ? Drive. ? Use heavy machinery. ? Drink alcohol. ? Take sleeping pills or medicines that cause drowsiness. ? Make important decisions or sign legal documents. ? Take care of children on your own. Eating and drinking  Follow any instructions from your health care provider about eating or drinking restrictions.  When you feel hungry, start by eating small amounts of foods that are soft and easy to digest (bland), such as toast. Gradually return to your regular diet.  Drink enough fluid to keep your urine pale yellow.  If you vomit, rehydrate by drinking water, juice, or clear broth. General instructions  If you have sleep apnea, surgery and certain medicines can increase your risk for breathing problems. Follow instructions from your health care provider about wearing your sleep device: ? Anytime you are sleeping, including during daytime naps. ? While taking prescription pain medicines, sleeping medicines, or medicines that make you  drowsy.  Return to your normal activities as told by your health care provider. Ask your health care provider what activities are safe for you.  Take over-the-counter and prescription medicines only as told by your health care provider.  If you smoke, do not smoke without supervision.  Keep all follow-up visits as told by your health care provider. This is important. Contact a health care provider if:  You have nausea or vomiting that does not get better with medicine.  You cannot eat or drink without vomiting.  You have pain that does not get better with medicine.  You are unable to pass urine.  You develop a skin rash.  You have a fever.  You have redness around your IV site that gets worse. Get help right away if:  You have difficulty breathing.  You have chest pain.  You have blood in your urine or stool, or you vomit blood. Summary  After the procedure, it is common to have a sore throat or nausea. It is also common to feel tired.  Have  a responsible adult stay with you for the first 24 hours after general anesthesia. It is important to have someone help care for you until you are awake and alert.  When you feel hungry, start by eating small amounts of foods that are soft and easy to digest (bland), such as toast. Gradually return to your regular diet.  Drink enough fluid to keep your urine pale yellow.  Return to your normal activities as told by your health care provider. Ask your health care provider what activities are safe for you. This information is not intended to replace advice given to you by your health care provider. Make sure you discuss any questions you have with your health care provider. Document Revised: 10/29/2017 Document Reviewed: 06/11/2017 Elsevier Patient Education  2020 [employer].

## 2020-02-26 NOTE — ED Triage Notes (Signed)
Pt c/o righ tside flank and abd pain that became worse tonight, has hx of kidney stones.

## 2020-02-26 NOTE — Transfer of Care (Signed)
Immediate Anesthesia Transfer of Care Note  Patient: Roger Thompson  Procedure(s) Performed: CYSTOSCOPY WITH RETROGRADE PYELOGRAM, URETEROSCOPY AND STENT PLACEMENT (Right Ureter)  Patient Location: PACU  Anesthesia Type:General  Level of Consciousness: awake and alert   Airway & Oxygen Therapy: Patient Spontanous Breathing and Patient connected to face mask oxygen  Post-op Assessment: Report given to RN and Post -op Vital signs reviewed and stable  Post vital signs: Reviewed and stable  Last Vitals:  Vitals Value Taken Time  BP    Temp    Pulse 68 02/26/20 1947  Resp 14 02/26/20 1947  SpO2 99 % 02/26/20 1947  Vitals shown include unvalidated device data.  Last Pain:  Vitals:   02/26/20 1600  TempSrc: Oral  PainSc: 3       Patients Stated Pain Goal: 3 (02/26/20 1600)  Complications: No apparent anesthesia complications

## 2020-02-26 NOTE — Anesthesia Procedure Notes (Signed)
Date/Time: 02/26/2020 7:36 PM Performed by: Minerva Ends, CRNA Oxygen Delivery Method: Simple face mask Placement Confirmation: positive ETCO2 and breath sounds checked- equal and bilateral Dental Injury: Teeth and Oropharynx as per pre-operative assessment

## 2020-02-26 NOTE — H&P (Signed)
have ureteral stone.  HPI: Roger Thompson is a 34 year-old male established patient who is here for ureteral stone.  02/22/20: CT done 02/21/2020 with right distal 2 mm stone and right HUN. WBC was 6.5, Cr 0.89, UA > 50 rbc and no bac.   He's been taking Zofran. Nausea worse than the pain. Manager at [employer]. Stone hasn't passed. Not on tamsulosin.   02/26/20: Patient with above-noted history. He returns today with complaints of refractory symptoms. He states that he was doing well until yesterday evening when he began experiencing more acute pain and nausea. He was seen in the emergency department early this morning and symptoms were controlled following IV Dilaudid, Toradol, and Zofran. However, symptoms have returned. He presents today with complaints of persistent nausea with associated emesis. He has been unable to tolerate much by mouth since 6:00 p.m. last night. He states that he last took medication at approximately 7:00 a.m., he was uncertain if this was medication for pain or nausea. He had an episode of emesis following this, however. He continues to complain of right flank pain, which radiates to his right lower quadrant. He denies significant voiding symptoms, but does not feel he has been voiding as much today due to lack of hydration. He denies fevers, has had some chills. He denies cardiac hx. No past issues with general anesthesia per his report.   The problem is on the right side. He first stated noticing pain on 02/21/2020. This is not his first kidney stone. He is not currently having flank pain, back pain, groin pain, nausea, vomiting, fever or chills. Pain is occuring on the right side. He has not caught a stone in his urine strainer since his symptoms began.   He has never had surgical treatment for calculi in the past.     ALLERGIES: None   MEDICATIONS: Tamsulosin Hcl 0.4 mg capsule 1 capsule PO Q HS  Hydrocodone-Acetaminophen  Promethazine Hcl 25 mg tablet 1 tablet PO Q 6 H   Zofran 4 mg tablet     GU PSH: None   NON-GU PSH: Appendectomy - 2018     GU PMH: Ureteral calculus, He was given ketorolac 60 mg IM x 1 and promethazine 25 mg IM. He is feeling much better. I discussed with the patient the nature risks and benefits of continued stone passage, off label use of alpha blockers, shockwave lithotripsy or ureteroscopy. All questions answered. Will start tamsulosin, Rx for promethazine and rotate some ibuprofen. See back in 1 week for poss KUB or limited Rt renal US. - 02/22/2020 Renal calculus    NON-GU PMH: None   FAMILY HISTORY: 1 Daughter - Other 1 son - Other   SOCIAL HISTORY: Marital Status: Married Current Smoking Status: Patient has never smoked.   Tobacco Use Assessment Completed: Used Tobacco in last 30 days? Drinks 1 drink per day.  Drinks 3 caffeinated drinks per day. Patient's occupation Public relations account executive.    REVIEW OF SYSTEMS:    GU Review Male:   Patient denies frequent urination, hard to postpone urination, burning/ pain with urination, get up at night to urinate, leakage of urine, stream starts and stops, trouble starting your stream, have to strain to urinate , erection problems, and penile pain.  Gastrointestinal (Upper):   Patient reports nausea and vomiting. Patient denies indigestion/ heartburn.  Gastrointestinal (Lower):   Patient denies diarrhea and constipation.  Constitutional:   Patient denies fever, night sweats, weight loss, and fatigue.  Skin:   Patient  denies skin rash/ lesion and itching.  Eyes:   Patient denies blurred vision and double vision.  Ears/ Nose/ Throat:   Patient denies sore throat and sinus problems.  Hematologic/Lymphatic:   Patient denies swollen glands and easy bruising.  Cardiovascular:   Patient denies leg swelling and chest pains.  Respiratory:   Patient denies cough and shortness of breath.  Endocrine:   Patient denies excessive thirst.  Musculoskeletal:   Patient denies back pain and joint pain.   Neurological:   Patient reports headaches. Patient denies dizziness.  Psychologic:   Patient denies depression and anxiety.   Notes: history of kidney stones, NO UA , R side flank pain and lower abdomen     VITAL SIGNS:      02/26/2020 10:33 AM  Weight 245 lb / 111.13 kg  Height 74 in / 187.96 cm  BP 121/75 mmHg  Heart Rate 47 /min  Temperature 97.8 F / 36.5 C  BMI 31.5 kg/m   MULTI-SYSTEM PHYSICAL EXAMINATION:    Constitutional: Well-nourished. No physical deformities. Normally developed. Good grooming. Appears uncomfortable.   Respiratory: No labored breathing, no use of accessory muscles.   Cardiovascular: Normal temperature, normal extremity pulses, no swelling, no varicosities.  Neurologic / Psychiatric: Oriented to time, oriented to place, oriented to person. No depression, no anxiety, no agitation.  Gastrointestinal: No mass, no tenderness, no rigidity, non obese abdomen. Right CVAT.   Musculoskeletal: Normal gait and station of head and neck.     Complexity of Data:  Records Review:   Previous Doctor Records, Previous Patient Records  Urine Test Review:   Urinalysis  X-Ray Review: KUB: Reviewed Films.  C.T. Abdomen/Pelvis: Reviewed Films. Reviewed Report.     PROCEDURES:         KUB - [id number]  A single view of the abdomen is obtained. No obvious opacities noted within the confines of bilateral renal shadows. There continues to be a questionable opacity noted along the expected anatomical course of the right distal ureter, which may be representative of distal ureteral calculus. Stable pelvic phleboliths.      Patient confirmed No Neulasta OnPro Device.           Urinalysis Dipstick Dipstick Cont'd  Color: Amber Bilirubin: Neg mg/dL  Appearance: Clear Ketones: Neg mg/dL  Specific Gravity: 4.967 Blood: Neg ery/uL  pH: 6.0 Protein: Trace mg/dL  Glucose: Neg mg/dL Urobilinogen: 0.2 mg/dL    Nitrites: Neg    Leukocyte Esterase: Neg leu/uL         Morphine 4mg  -  J2270, Qty: 4 Adm. By: [id number]  Unit: mg Lot No Kyla Balzarine  Route: IM Exp. Date 04/30/2020  Freq: None Mfgr.:   Site: Left Buttock         Phenergan 25mg  - J2550, 05/02/2020 The injection site was sterilely prepped with alcohol. Phenergan was injected (IM) using standard technique. The patient tolerated the procedure well. A band aid was applied. The site was dry when the patient left the exam room.   After treatment was administered, patient was observed to be symptom-free for 10-15 minutes.   Qty: 12.5 Adm. By:  Unit: mg Lot No [id number]  Route: IM Exp. Date 06/30/2020  Freq: None Mfgr.:   Site: Right Buttock   ASSESSMENT:      ICD-10 Details  1 GU:   Ureteral calculus - N20.1    PLAN:           Orders X-Rays: KUB  Schedule Procedure: 02/26/2020 at [facility] - [phone] - Morphine 4mg  (Ther/Proph/Diag Inj, [place]/Im) - 84696, E9528  Procedure: 02/26/2020 at [facility] - [phone] - Phenergan 25mg  (Phenergan Per 50 Mg) - J2550, [id number] Notes: 12.5 mg           Document Letter(s):  Created for Patient: Clinical Summary         Notes:   Patient presents today with refractory symptoms. He was given injections for morphine and promethazine when he arrived to clinic with only limited benefit noted. KUB imaging today continues to show questionable opacity along the expected anatomical course of the right distal ureter. Given his presentation, case reviewed with the on-call urologist. Will plan for cystoscopy, right retrograde pyelogram, right ureteroscopy, and possible right ureteral stent placement later this evening. I reviewed the procedure and the potential risk of the procedure in detail with him today. He voiced understanding and would like to proceed. He will remain NPO from this point forward and present to [facility] later today for scheduled procedure.

## 2020-02-26 NOTE — ED Provider Notes (Signed)
[facility] EMERGENCY DEPARTMENT Provider Note   CSN: [id number] Arrival date & time: 02/26/20  0149     History Chief Complaint  Patient presents with  . Flank Pain    Galvin Aversa is a 34 y.o. male.  Patient presents to the ER with complaints of severe right flank pain.  Patient was diagnosed with recurrent ureterolithiasis 3 days ago.  Pain suddenly worsened tonight.  He has been experiencing nausea and vomiting associated with the pain.        Past Medical History:  Diagnosis Date  . Kidney stones     There are no problems to display for this patient.   Past Surgical History:  Procedure Laterality Date  . APPENDECTOMY         No family history on file.  Social History   Tobacco Use  . Smoking status: Never Smoker  . Smokeless tobacco: Never Used  Substance Use Topics  . Alcohol use: Never  . Drug use: Never    Home Medications Prior to Admission medications   Medication Sig Start Date End Date Taking? Authorizing Provider  diphenhydrAMINE (BENADRYL) 25 mg capsule Take 25 mg by mouth every 6 (six) hours as needed for allergies.   Yes [provider]  HYDROcodone-acetaminophen (NORCO) 5-325 MG tablet Take 1 tablet by mouth every 4 (four) hours as needed for severe pain. 02/21/20  Yes Pricilla Loveless, MD  levocetirizine (XYZAL) 5 MG tablet Take 5 mg by mouth every evening.   Yes [provider]  tamsulosin (FLOMAX) 0.4 MG CAPS capsule Take 0.4 mg by mouth.   Yes [provider]  ondansetron (ZOFRAN ODT) 4 MG disintegrating tablet Take 1 tablet (4 mg total) by mouth every 8 (eight) hours as needed for nausea or vomiting. 02/21/20   Pricilla Loveless, MD  oxyCODONE-acetaminophen (PERCOCET) 5-325 MG tablet Take 2 tablets by mouth every 4 (four) hours as needed. 02/26/20   Gilda Crease, MD  oxyCODONE-acetaminophen (PERCOCET) 5-325 MG tablet Take 1-2 tablets by mouth every 4 (four) hours as needed. 02/26/20   Gilda Crease, MD    Allergies    Patient has no known allergies.  Review of Systems   Review of Systems  Gastrointestinal: Positive for nausea and vomiting.  Genitourinary: Positive for flank pain.  All other systems reviewed and are negative.   Physical Exam Updated Vital Signs BP (!) 146/91   Pulse (!) 50   Temp 98.3 F (36.8 C) (Oral)   Resp 20   Ht 6\' 2"  (1.88 m)   Wt 111 kg   SpO2 99%   BMI 31.42 kg/m   Physical Exam Vitals and nursing note reviewed.  Constitutional:      General: He is in acute distress.     Appearance: Normal appearance. He is well-developed.  HENT:     Head: Normocephalic and atraumatic.     Right Ear: Hearing normal.     Left Ear: Hearing normal.     Nose: Nose normal.  Eyes:     Conjunctiva/sclera: Conjunctivae normal.     Pupils: Pupils are equal, round, and reactive to light.  Cardiovascular:     Rate and Rhythm: Regular rhythm.     Heart sounds: S1 normal and S2 normal. No murmur. No friction rub. No gallop.   Pulmonary:     Effort: Pulmonary effort is normal. No respiratory distress.     Breath sounds: Normal breath sounds.  Chest:     Chest wall: No tenderness.  Abdominal:     General: Bowel sounds are normal.     Palpations: Abdomen is soft.     Tenderness: There is no abdominal tenderness. There is no guarding or rebound. Negative signs include Murphy's sign and McBurney's sign.     Hernia: No hernia is present.  Musculoskeletal:        General: Normal range of motion.     Cervical back: Normal range of motion and neck supple.  Skin:    General: Skin is warm and dry.     Findings: No rash.  Neurological:     Mental Status: He is alert and oriented to person, place, and time.     GCS: GCS eye subscore is 4. GCS verbal subscore is 5. GCS motor subscore is 6.     Cranial Nerves: No cranial nerve deficit.     Sensory: No sensory deficit.     Coordination: Coordination normal.  Psychiatric:        Speech: Speech normal.         Behavior: Behavior normal.        Thought Content: Thought content normal.     ED Results / Procedures / Treatments   Labs (all labs ordered are listed, but only abnormal results are displayed) Labs Reviewed - No data to display  EKG None  Radiology No results found.  Procedures Procedures (including critical care time)  Medications Ordered in ED Medications  HYDROmorphone (DILAUDID) injection 1 mg (has no administration in time range)  ondansetron (ZOFRAN) injection 4 mg (4 mg Intravenous Given 02/26/20 0229)  HYDROmorphone (DILAUDID) injection 1 mg (1 mg Intravenous Given 02/26/20 0230)  ketorolac (TORADOL) 30 MG/ML injection 30 mg (30 mg Intravenous Given 02/26/20 0230)    ED Course  I have reviewed the triage vital signs and the nursing notes.  Pertinent labs & imaging results that were available during my care of the patient were reviewed by me and considered in my medical decision making (see chart for details).    MDM Rules/Calculators/A&P                      Patient presents with renal colic.  He has a known 2 mm distal right ureteral stone at the UVJ.  Patient reports sudden worsening of pain tonight.  He has run out of his pain pills.  Patient has associated nausea and vomiting.  He has significantly improved with treatment here in the ER.  He has follow-up scheduled this week with urology.  Final Clinical Impression(s) / ED Diagnoses Final diagnoses:  Ureteral colic    Rx / DC Orders ED Discharge Orders         Ordered    oxyCODONE-acetaminophen (PERCOCET) 5-325 MG tablet  Every 4 hours PRN     02/26/20 0401    oxyCODONE-acetaminophen (PERCOCET) 5-325 MG tablet  Every 4 hours PRN     02/26/20 0402           Orpah Greek, MD 02/26/20 0403

## 2020-02-26 NOTE — Interval H&P Note (Signed)
History and Physical Interval Note:  he still has pain.   02/26/2020 6:34 PM  Roque Lias  has presented today for surgery, with the diagnosis of right ureteral obstruction.  The various methods of treatment have been discussed with the patient and family. After consideration of risks, benefits and other options for treatment, the patient has consented to  Procedure(s): CYSTOSCOPY WITH RETROGRADE PYELOGRAM, URETEROSCOPY AND STENT PLACEMENT POSSIBLE LASER (Right) as a surgical intervention.  The patient's history has been reviewed, patient examined, no change in status, stable for surgery.  I have reviewed the patient's chart and labs.  Questions were answered to the patient's satisfaction.     Bjorn Pippin

## 2020-02-26 NOTE — Anesthesia Procedure Notes (Signed)
Procedure Name: LMA Insertion Date/Time: 02/26/2020 6:59 PM Performed by: Minerva Ends, CRNA Pre-anesthesia Checklist: Patient identified, Emergency Drugs available, Suction available and Patient being monitored Patient Re-evaluated:Patient Re-evaluated prior to induction Oxygen Delivery Method: Circle System Utilized Preoxygenation: Pre-oxygenation with 100% oxygen Induction Type: IV induction Ventilation: Mask ventilation without difficulty LMA: LMA inserted and LMA with gastric port inserted LMA Size: 4.0 Number of attempts: 1 Placement Confirmation: positive ETCO2 Tube secured with: Tape Dental Injury: Teeth and Oropharynx as per pre-operative assessment  Comments: Smooth IV induction - Rose-- LMA inserted AM CRNA-- atraumatic-- missing teeth and chipped front teeth-- unchanged with LMA insertion

## 2020-02-27 MED FILL — Oxycodone w/ Acetaminophen Tab 5-325 MG: ORAL | Qty: 6 | Status: AC

## 2021-03-04 ENCOUNTER — Ambulatory Visit (INDEPENDENT_AMBULATORY_CARE_PROVIDER_SITE_OTHER): Payer: No Typology Code available for payment source | Admitting: Psychologist

## 2021-03-04 DIAGNOSIS — F330 Major depressive disorder, recurrent, mild: Secondary | ICD-10-CM

## 2021-03-04 DIAGNOSIS — F411 Generalized anxiety disorder: Secondary | ICD-10-CM | POA: Diagnosis not present

## 2021-03-21 ENCOUNTER — Ambulatory Visit: Payer: No Typology Code available for payment source | Admitting: Psychologist

## 2021-10-30 ENCOUNTER — Ambulatory Visit (INDEPENDENT_AMBULATORY_CARE_PROVIDER_SITE_OTHER): Payer: No Typology Code available for payment source

## 2021-10-30 ENCOUNTER — Ambulatory Visit: Payer: No Typology Code available for payment source | Admitting: Podiatry

## 2021-10-30 ENCOUNTER — Encounter: Payer: Self-pay | Admitting: Podiatry

## 2021-10-30 ENCOUNTER — Other Ambulatory Visit: Payer: Self-pay

## 2021-10-30 ENCOUNTER — Ambulatory Visit
Admission: EM | Admit: 2021-10-30 | Discharge: 2021-10-30 | Disposition: A | Discharge status: Home / Self Care | Payer: No Typology Code available for payment source | Attending: Family Medicine | Admitting: Family Medicine

## 2021-10-30 DIAGNOSIS — M7732 Calcaneal spur, left foot: Secondary | ICD-10-CM

## 2021-10-30 DIAGNOSIS — M79672 Pain in left foot: Secondary | ICD-10-CM

## 2021-10-30 DIAGNOSIS — M722 Plantar fascial fibromatosis: Secondary | ICD-10-CM

## 2021-10-30 MED ORDER — MELOXICAM 15 MG PO TABS: 15.0000 mg | ORAL_TABLET | Freq: Every day | ORAL | 3 refills | Status: DC

## 2021-10-30 MED ORDER — PREDNISONE 20 MG PO TABS: 40.0000 mg | ORAL_TABLET | Freq: Every day | ORAL | 0 refills | Status: DC

## 2021-10-30 NOTE — ED Provider Notes (Signed)
[facility]    CSN: [id number] Arrival date & time: 10/30/21  1112      History   Chief Complaint Chief Complaint  Patient presents with   Foot Pain    Left foot pain    HPI Roger Thompson is a 35 y.o. male.   Patient ending today with about 6 months of waxing and waning left plantar heel pain.  He states the pain continues to get worse, especially with days where he is more active.  His pain ranges from a 3 to a 10 out of 10 and has days where he avoids walking because the pain is so severe.  Sometimes some swelling in the area but never any discoloration, numbness, tingling, decreased range of motion.  No known injury that he is aware of.  States he had Planter fasciitis on the right side so has been trying his brace from that incident and some home stretches, soaks, pain relievers without any benefit.  No past orthopedic issues in the foot or injuries.  Past Medical History:  Diagnosis Date   Kidney stones     There are no problems to display for this patient.   Past Surgical History:  Procedure Laterality Date   APPENDECTOMY     CYSTOSCOPY WITH RETROGRADE PYELOGRAM, URETEROSCOPY AND STENT PLACEMENT Right 02/26/2020   Procedure: CYSTOSCOPY WITH RETROGRADE PYELOGRAM, URETEROSCOPY AND STENT PLACEMENT;  Surgeon: Bjorn Pippin, MD;  Location: [facility];  Service: Urology;  Laterality: Right;       Home Medications    Prior to Admission medications   Medication Sig Start Date End Date Taking? Authorizing Provider  predniSONE (DELTASONE) 20 MG tablet Take 2 tablets (40 mg total) by mouth daily with breakfast. 10/30/21  Yes Particia Nearing, PA-C  diphenhydrAMINE (BENADRYL) 25 mg capsule Take 25 mg by mouth every 6 (six) hours as needed for allergies.    [provider]  levocetirizine (XYZAL) 5 MG tablet Take 5 mg by mouth daily as needed for allergies.     [provider]  ondansetron (ZOFRAN ODT) 4 MG disintegrating tablet Take 1 tablet (4  mg total) by mouth every 8 (eight) hours as needed for nausea or vomiting. Patient not taking: Reported on 02/26/2020 02/21/20   Pricilla Loveless, MD  oxyCODONE-acetaminophen (PERCOCET) 5-325 MG tablet Take 1 tablet by mouth every 4 (four) hours as needed for moderate pain or severe pain. 02/26/20   Bjorn Pippin, MD  phenazopyridine (PYRIDIUM) 200 MG tablet Take 1 tablet (200 mg total) by mouth 3 (three) times daily as needed for pain. 02/26/20   Bjorn Pippin, MD  promethazine (PHENERGAN) 25 MG tablet Take 25 mg by mouth every 6 (six) hours as needed for nausea or vomiting.    [provider]  tamsulosin (FLOMAX) 0.4 MG CAPS capsule Take 0.4 mg by mouth daily after supper.     [provider]    Family History Family History  Problem Relation Age of Onset   Healthy Mother    Healthy Brother     Social History Social History   Tobacco Use   Smoking status: Never   Smokeless tobacco: Never  Vaping Use   Vaping Use: Never used  Substance Use Topics   Alcohol use: Not Currently    Comment: Occasionally   Drug use: Never     Allergies   Patient has no known allergies.   Review of Systems Review of Systems Per HPI  Physical Exam Triage Vital Signs ED Triage Vitals  Enc Vitals Group     BP 10/30/21 1245 120/77     Pulse Rate 10/30/21 1245 (!) 53     Resp 10/30/21 1245 20     Temp 10/30/21 1245 97.8 F (36.6 C)     Temp Source 10/30/21 1245 Oral     SpO2 10/30/21 1245 96 %     Weight --      Height --      Head Circumference --      Peak Flow --      Pain Score 10/30/21 1242 3     Pain Loc --      Pain Edu? --      Excl. in GC? --    No data found.  Updated Vital Signs BP 120/77 (BP Location: Right Arm)    Pulse (!) 53    Temp 97.8 F (36.6 C) (Oral)    Resp 20    SpO2 96%   Visual Acuity Right Eye Distance:   Left Eye Distance:   Bilateral Distance:    Right Eye Near:   Left Eye Near:    Bilateral Near:     Physical Exam Vitals and nursing  note reviewed.  Constitutional:      Appearance: Normal appearance.  HENT:     Head: Atraumatic.  Eyes:     Extraocular Movements: Extraocular movements intact.     Conjunctiva/sclera: Conjunctivae normal.  Cardiovascular:     Rate and Rhythm: Normal rate and regular rhythm.  Pulmonary:     Effort: Pulmonary effort is normal.     Breath sounds: Normal breath sounds.  Musculoskeletal:        General: Tenderness present. No swelling, deformity or signs of injury. Normal range of motion.     Cervical back: Normal range of motion and neck supple.     Comments: Mild tenderness to palpation plantar surface of left heel.  No bony deformity palpable, range of motion and strength full and intact.  Skin:    General: Skin is warm and dry.  Neurological:     General: No focal deficit present.     Mental Status: He is oriented to person, place, and time.     Comments: Left foot neurovascularly intact  Psychiatric:        Mood and Affect: Mood normal.        Thought Content: Thought content normal.        Judgment: Judgment normal.     UC Treatments / Results  Labs (all labs ordered are listed, but only abnormal results are displayed) Labs Reviewed - No data to display  EKG   Radiology DG Foot Complete Left  Result Date: 10/30/2021 CLINICAL DATA:  Left heel pain for the past 6 months. EXAM: LEFT FOOT - COMPLETE 3+ VIEW COMPARISON:  None. FINDINGS: No fracture or dislocation. No significant hallux valgus deformity. Joint spaces are preserved. No erosions. Tiny plantar calcaneal spur. Regional soft tissues appear normal. No radiopaque foreign body. IMPRESSION: Tiny plantar calcaneal spur. Otherwise, no explanation for patient's persistent foot/heel pain. Electronically Signed   By: Simonne Come M.D.   On: 10/30/2021 13:37    Procedures Procedures (including critical care time)  Medications Ordered in UC Medications - No data to display  Initial Impression / Assessment and Plan / UC  Course  I have reviewed the triage vital signs and the nursing notes.  Pertinent labs & imaging results that were available during my care of the patient were reviewed by me  and considered in my medical decision making (see chart for details).     Small left plantar heel spur present on left foot x-ray today, possibly causing the waxing and waning inflammatory pain that he is experiencing.  We will treat with a course of prednisone, discussed continued supportive home care for Planter fasciitis and podiatry follow-up if recurring.  Final Clinical Impressions(s) / UC Diagnoses   Final diagnoses:  Inflammatory pain of left heel  Heel spur, left     Discharge Instructions      [facility]) Address: [address] [place], [place], [place] [address] Phone: [phone]    ED Prescriptions     Medication Sig Dispense Auth. Provider   predniSONE (DELTASONE) 20 MG tablet Take 2 tablets (40 mg total) by mouth daily with breakfast. 10 tablet Particia Nearing, [place]      PDMP not reviewed this encounter.   Particia Nearing, [place] 10/30/21 1400

## 2021-10-30 NOTE — Discharge Instructions (Signed)
[facility] ([facility] Address: [address], [place], [place] [address] Phone: [phone] 

## 2021-10-30 NOTE — ED Triage Notes (Addendum)
Patient states that his left foot has been hurting for more than 6 months.   Patient states he has fascitis in the left foot.   He states he has tried some of the tips his doctor gave him but they are not working.   Patient states that his pain in his foot is getting worse everyday.   Patient states that today his pain level inthat foot is a 3 but it gets up to a 10 on some days to where he cannot alk.  Denies Injury

## 2021-10-30 NOTE — Patient Instructions (Signed)

## 2021-10-31 NOTE — Progress Notes (Signed)
°  Subjective:  Patient ID: Roger Thompson, male    DOB: 01/30/1986,  MRN: [id number]  Chief Complaint  Patient presents with   Foot Pain      NP L  foot pain  - seen [facility]    35 y.o. male presents with the above complaint. History confirmed with patient.  Heel pain has been going on for about 6 weeks.  He went to urgent care today and had x-rays completed.  Works at [employer] on his feet.  He had Planter fasciitis on the right side previously and did stretching and a night splint he has friends that are physical therapist and they have worked him through exercises at home which she is currently doing for the left side.  Objective:  Physical Exam: warm, good capillary refill, no trophic changes or ulcerative lesions, normal DP and PT pulses, and normal sensory exam. Left Foot: point tenderness over the heel pad and point tenderness of the mid plantar fascia Right Foot: normal exam, no swelling, tenderness, instability; ligaments intact, full range of motion of all ankle/foot joints  No images are attached to the encounter.  Radiographs: Multiple views x-ray of the left foot: no fracture, dislocation, swelling or degenerative changes noted, plantar calcaneal spur, and posterior calcaneal spur Assessment:   1. Plantar fasciitis of left foot      Plan:  Patient was evaluated and treated and all questions answered.  Discussed the etiology and treatment options for plantar fasciitis including stretching, formal physical therapy, supportive shoegears such as a running shoe or sneaker, pre fabricated orthoses, injection therapy, and oral medications. We also discussed the role of surgical treatment of this for patients who do not improve after exhausting non-surgical treatment options.   -XR reviewed with patient -Educated patient on stretching and icing of the affected limb -Injection delivered to the plantar fascia of the left foot. -Rx for meloxicam. Educated on use,  risks and benefits of the medication -Rx for medrol pack. Educated on use, risks, and benefits of the medication -CAM boot dispensed  After sterile prep with povidone-iodine solution and alcohol, the left heel was injected with 0.5cc 2% xylocaine plain, 0.5cc 0.5% marcaine plain, 5mg  triamcinolone acetonide, and 2mg  dexamethasone was injected along the medial plantar fascia at the insertion on the plantar calcaneus. The patient tolerated the procedure well without complication.   Return in about 1 month (around 11/30/2021) for recheck plantar fasciitis.

## 2021-12-02 ENCOUNTER — Other Ambulatory Visit: Payer: Self-pay

## 2021-12-02 ENCOUNTER — Ambulatory Visit: Payer: No Typology Code available for payment source | Admitting: Podiatry

## 2021-12-02 DIAGNOSIS — M722 Plantar fascial fibromatosis: Secondary | ICD-10-CM | POA: Diagnosis not present

## 2021-12-07 NOTE — Progress Notes (Signed)
°  Subjective:  Patient ID: Roger Thompson, male    DOB: 12/25/1985,  MRN: [id number]  Chief Complaint  Patient presents with   Plantar Fasciitis    Left foot - 1 month follow up    36 y.o. male presents with the above complaint. History confirmed with patient.  Heel pain has been going on for about 6 weeks.  He went to urgent care today and had x-rays completed.  Works at [employer] on his feet.  He had Planter fasciitis on the right side previously and did stretching and a night splint he has friends that are physical therapist and they have worked him through exercises at home which she is currently doing for the left side.   Interval history About 60% better he is doing the home therapy plan and taking the meloxicam Objective:  Physical Exam: warm, good capillary refill, no trophic changes or ulcerative lesions, normal DP and PT pulses, and normal sensory exam. Left Foot: Minimal point tenderness over the heel pad and point tenderness of the mid plantar fascia Right Foot: normal exam, no swelling, tenderness, instability; ligaments intact, full range of motion of all ankle/foot joints  No images are attached to the encounter.  Radiographs: Multiple views x-ray of the left foot: no fracture, dislocation, swelling or degenerative changes noted, plantar calcaneal spur, and posterior calcaneal spur Assessment:   1. Plantar fasciitis of left foot       Plan:  Patient was evaluated and treated and all questions answered.  Discussed the etiology and treatment options for plantar fasciitis including stretching, formal physical therapy, supportive shoegears such as a running shoe or sneaker, pre fabricated orthoses, injection therapy, and oral medications. We also discussed the role of surgical treatment of this for patients who do not improve after exhausting non-surgical treatment options.  Doing much better can transition out of boot back to regular shoe gears which she has done.   Continue meloxicam as needed.  Continue home therapy plan as well.  Hopefully resolves the next 6 weeks or so.  If not we will consider further injection at the next visit if not improved..   No follow-ups on file.

## 2022-01-06 ENCOUNTER — Encounter: Payer: Self-pay | Admitting: Podiatry

## 2022-01-06 ENCOUNTER — Other Ambulatory Visit: Payer: Self-pay

## 2022-01-06 ENCOUNTER — Ambulatory Visit: Payer: No Typology Code available for payment source | Admitting: Podiatry

## 2022-01-06 DIAGNOSIS — M722 Plantar fascial fibromatosis: Secondary | ICD-10-CM

## 2022-01-06 NOTE — Progress Notes (Signed)
°  Subjective:  Patient ID: Roger Thompson, male    DOB: September 16, 1986,  MRN: [id number]  Chief Complaint  Patient presents with   Plantar Fasciitis    recheck plantar fasciitis left    36 y.o. male presents with the above complaint. History confirmed with patient.  Heel pain has been going on for about 6 weeks.  He went to urgent care today and had x-rays completed.  Works at [employer] on his feet.  He had Planter fasciitis on the right side previously and did stretching and a night splint he has friends that are physical therapist and they have worked him through exercises at home which she is currently doing for the left side.   Interval history Feeling has not improved much since last visit.  His work schedule has increased quite a bit and he is on his feet with busy spring season and is hurting quite a bit more now on the outside of the heel as well. Objective:  Physical Exam: warm, good capillary refill, no trophic changes or ulcerative lesions, normal DP and PT pulses, and normal sensory exam. Left Foot: Minimal point tenderness over the heel pad both medially and laterally less in the mid plantar fascia now Right Foot: normal exam, no swelling, tenderness, instability; ligaments intact, full range of motion of all ankle/foot joints  No images are attached to the encounter.  Radiographs: Multiple views x-ray of the left foot: no fracture, dislocation, swelling or degenerative changes noted, plantar calcaneal spur, and posterior calcaneal spur Assessment:   1. Plantar fasciitis of left foot       Plan:  Patient was evaluated and treated and all questions answered.  Has had some regression with increase in his work schedule.  I recommended further injection which was done with a total of 10 mg of Kenalog and 4 mg dexamethasone from both a medial and lateral approach split evenly with 1 cc of lidocaine and Marcaine.  He tolerated well.  I discussed if not improving by next visit  would recommend an MRI to evaluate for plantar fascial tear or other etiologies that could be contributing to this.    Return in about 1 month (around 02/03/2022).

## 2022-02-03 ENCOUNTER — Ambulatory Visit: Payer: No Typology Code available for payment source | Admitting: Podiatry

## 2022-02-03 ENCOUNTER — Other Ambulatory Visit: Payer: Self-pay

## 2022-02-03 DIAGNOSIS — M722 Plantar fascial fibromatosis: Secondary | ICD-10-CM | POA: Diagnosis not present

## 2022-02-05 ENCOUNTER — Ambulatory Visit (INDEPENDENT_AMBULATORY_CARE_PROVIDER_SITE_OTHER): Payer: No Typology Code available for payment source

## 2022-02-05 DIAGNOSIS — M722 Plantar fascial fibromatosis: Secondary | ICD-10-CM

## 2022-02-05 NOTE — Progress Notes (Signed)
SITUATION ?Reason for Consult: Evaluation for Bilateral Custom Foot Orthoses ?Patient / Caregiver Report: Patient is ready for foot orthotics ? ?OBJECTIVE DATA: ?Patient History / Diagnosis:  ?  ICD-10-CM   ?1. Plantar fasciitis of left foot  M72.2   ?  ? ? ?Current or Previous Devices:   None and no history ? ?Foot Examination: ?Skin presentation:   Intact ?Ulcers & Callousing:   None and no history ?Toe / Foot Deformities:  None ?Weight Bearing Presentation:  Rectus ?Sensation:    Intact ? ?Shoe Size:    71M ? ?ORTHOTIC RECOMMENDATION ?Recommended Device: 1x pair of custom functional foot orthotics ? ?GOALS OF ORTHOSES ?- Reduce Pain ?- Prevent Foot Deformity ?- Prevent Progression of Further Foot Deformity ?- Relieve Pressure ?- Improve the Overall Biomechanical Function of the Foot and Lower Extremity. ? ?ACTIONS PERFORMED ?Potential out of pocket cost was communicated to patient. Patient understood and consent to casting. Patient was casted for Foot Orthoses via crush box. Procedure was explained and patient tolerated procedure well. Casts were shipped to central fabrication. All questions were answered and concerns addressed. ? ?PLAN ?Patient is to be called for fitting when devices are ready.  ? ? ?

## 2022-02-06 ENCOUNTER — Encounter: Payer: Self-pay | Admitting: Podiatry

## 2022-02-06 NOTE — Progress Notes (Signed)
?  Subjective:  ?Patient ID: Roger Thompson, male    DOB: 1986/03/13,  MRN: [id number] ? ?Chief Complaint  ?Patient presents with  ? Plantar Fasciitis  ?  recheck plantar fasciitis. 1 MONTH F/U  ? ? ?36 y.o. male presents with the above complaint. History confirmed with patient.  Heel pain has been going on for about 6 weeks.  He went to urgent care today and had x-rays completed.  Works at [employer] on his feet.  He had Planter fasciitis on the right side previously and did stretching and a night splint he has friends that are physical therapist and they have worked him through exercises at home which she is currently doing for the left side. ? ? ?Interval history ?Since last visit it feels about the same.  Started around 50% better than it used to be still pain in the plantar fascia.  Injection at last visit did not help.  He is still working on the exercises at home. ?Objective:  ?Physical Exam: ?warm, good capillary refill, no trophic changes or ulcerative lesions, normal DP and PT pulses, and normal sensory exam. ?Left Foot: Minimal point tenderness over the heel pad both medially and laterally less in the mid plantar fascia now ?Right Foot: normal exam, no swelling, tenderness, instability; ligaments intact, full range of motion of all ankle/foot joints ? ?No images are attached to the encounter. ? ?Radiographs: ?Multiple views x-ray of the left foot: no fracture, dislocation, swelling or degenerative changes noted, plantar calcaneal spur, and posterior calcaneal spur ?Assessment:  ? ?1. Plantar fasciitis of left foot   ? ? ? ? ?Plan:  ?Patient was evaluated and treated and all questions answered. ? ?Still has not had major improvement.  This may going on for about 3 months now.  I recommended an MRI to evaluate the plantar fascia and the heel for any tears thickening or bone marrow edema.  This may guide any possible surgical intervention.  I also think long-term he will benefit from a custom molded  longitudinal arch support and offloading of the heel and plantar fascia.  I will have him scheduled to see our orthotist for this.  I will see him back after the MRI for evaluation ? ? ?Return for after MRI to review.  ? ?

## 2022-02-21 ENCOUNTER — Ambulatory Visit
Admission: RE | Admit: 2022-02-21 | Discharge: 2022-02-21 | Disposition: A | Discharge status: Home / Self Care | Payer: No Typology Code available for payment source | Source: Ambulatory Visit | Attending: Podiatry | Admitting: Podiatry

## 2022-02-21 DIAGNOSIS — M722 Plantar fascial fibromatosis: Secondary | ICD-10-CM

## 2022-03-16 ENCOUNTER — Other Ambulatory Visit: Payer: Self-pay | Admitting: Podiatry

## 2022-03-19 ENCOUNTER — Encounter: Payer: Self-pay | Admitting: Podiatry

## 2022-03-30 ENCOUNTER — Ambulatory Visit: Payer: No Typology Code available for payment source

## 2022-03-30 ENCOUNTER — Encounter: Payer: Self-pay | Admitting: Podiatry

## 2022-03-30 ENCOUNTER — Ambulatory Visit: Payer: No Typology Code available for payment source | Admitting: Podiatry

## 2022-03-30 DIAGNOSIS — M21862 Other specified acquired deformities of left lower leg: Secondary | ICD-10-CM | POA: Diagnosis not present

## 2022-03-30 DIAGNOSIS — M722 Plantar fascial fibromatosis: Secondary | ICD-10-CM | POA: Diagnosis not present

## 2022-03-30 NOTE — Progress Notes (Signed)
SITUATION: Reason for Visit: Fitting and Delivery of Custom Fabricated Foot Orthoses Patient Report: Patient reports comfort and is satisfied with device.  OBJECTIVE DATA: Patient History / Diagnosis:     ICD-10-CM   1. Plantar fasciitis of left foot  M72.2       Provided Device:  Custom Functional Foot Orthotics     [facility]: [id number]  GOAL OF ORTHOSIS - Improve gait - Decrease energy expenditure - Improve Balance - Provide Triplanar stability of foot complex - Facilitate motion  ACTIONS PERFORMED Patient was fit with foot orthotics trimmed to shoe last. Patient tolerated fittign procedure.   Patient was provided with verbal and written instruction and demonstration regarding donning, doffing, wear, care, proper fit, function, purpose, cleaning, and use of the orthosis and in all related precautions and risks and benefits regarding the orthosis.  Patient was also provided with verbal instruction regarding how to report any failures or malfunctions of the orthosis and necessary follow up care. Patient was also instructed to contact our office regarding any change in status that may affect the function of the orthosis.  Patient demonstrated independence with proper donning, doffing, and fit and verbalized understanding of all instructions.  PLAN: Patient is to follow up in one week or as necessary (PRN). All questions were answered and concerns addressed. Plan of care was discussed with and agreed upon by the patient.

## 2022-03-30 NOTE — Progress Notes (Signed)
  Subjective:  Patient ID: Roger Thompson, male    DOB: 05/04/1986,  MRN: [id number]  Chief Complaint  Patient presents with   Plantar Fasciitis    Left foot, MRI review    36 y.o. male presents with the above complaint. History confirmed with patient.  Heel pain has been going on for about 6 weeks.  He went to urgent care today and had x-rays completed.  Works at [employer] on his feet.  He had Planter fasciitis on the right side previously and did stretching and a night splint he has friends that are physical therapist and they have worked him through exercises at home which she is currently doing for the left side.   Interval history Since last visit it feels about the same.  Has not improvement since last visit.  He is getting his orthotics today.  He completed the MRI.Marland Kitchen Objective:  Physical Exam: warm, good capillary refill, no trophic changes or ulcerative lesions, normal DP and PT pulses, and normal sensory exam. Left Foot: Continues to have point tenderness over the medial heel pad, significant extremities equinus noted r.  Radiographs: Multiple views x-ray of the left foot: no fracture, dislocation, swelling or degenerative changes noted, plantar calcaneal spur, and posterior calcaneal spur  IMPRESSION:: IMPRESSION: 1. Mild plantar fasciitis with tiny plantar calcaneal heel spur. 2. Mild flexor digitorum longus tenosynovitis of the level of the tibiotalar joint.     Electronically Signed   By: Neita Garnet M.D.   On: 02/22/2022 22:30 Assessment:   1. Plantar fasciitis of left foot   2. Gastrocnemius equinus of left lower extremity       Plan:  Patient was evaluated and treated and all questions answered.  We can discussed further treatment including surgical and nonsurgical treatment options.  He is getting his orthotics today and I think this will be helpful.  We will hold off on repeat injection until next month before his next camping trip with his son.  We  discussed proceeding with surgical correction of his Planter fasciitis.  I do think the gastrinomas equinus is a significant contributing factor and this would be addressed as well.  We discussed the risks benefits and potential complications of proceeding with surgery for this including but not limited to pain, swelling, infection, scar, numbness which may be temporary or permanent, chronic pain, stiffness, nerve pain or damage, wound healing problems.  He understands and wishes to proceed.  Informed consent was signed and reviewed.   Surgical plan:  Procedure: -Left lower extremity EPF and endoscopic gastrocnemius recession  Location: -[facility]  Anesthesia plan: -IV sedation with regional block  Postoperative pain plan: - Tylenol 1000 mg every 6 hours, ibuprofen 600 mg every 8 hours, gabapentin 300 mg every 8 hours x5 days, oxycodone 5 mg 1-2 tabs every 6 hours only as needed  DVT prophylaxis: -ASA 300 mg twice daily  WB Restrictions / DME needs: -WBAT in CAM boot postop  Return in about 22 days (around 04/21/2022) for plantar fasciitis left foot injection .

## 2022-03-31 DIAGNOSIS — M79676 Pain in unspecified toe(s): Secondary | ICD-10-CM

## 2022-04-10 ENCOUNTER — Telehealth: Payer: Self-pay | Admitting: Urology

## 2022-04-10 NOTE — Telephone Encounter (Signed)
DOS - 05/08/22  EPF LEFT --- [id number] GASTROCNEMIUS RECESS LEFT --- [id number]  [employer] EFFECTIVE DATE - 11/10/19  PLAN DEDUCTIBLE - $3,000.00 W/ $3,000.00 REMAINING OUT OF POCKET - $6,000.00 W/ $5,136.18 REMAINING COINSURANCE - 30% COPAY - $0.00   SPOKE WITH [employer]'S AUT[employer]IVE SYSTEM FOR CPT CODES 36644 AND 03474 NO PRIOR AUTH IS REQUIRED.  REF # [id number]

## 2022-04-21 ENCOUNTER — Ambulatory Visit (INDEPENDENT_AMBULATORY_CARE_PROVIDER_SITE_OTHER): Payer: No Typology Code available for payment source | Admitting: Podiatry

## 2022-04-21 DIAGNOSIS — M21862 Other specified acquired deformities of left lower leg: Secondary | ICD-10-CM

## 2022-04-21 DIAGNOSIS — M722 Plantar fascial fibromatosis: Secondary | ICD-10-CM | POA: Diagnosis not present

## 2022-04-24 NOTE — Progress Notes (Signed)
  Subjective:  Patient ID: Roger Thompson, male    DOB: 1985/11/16,  MRN: [id number]  No chief complaint on file.   36 y.o. male presents with the above complaint. History confirmed with patient.  Heel pain has been going on for about 6 weeks.  He went to urgent care today and had x-rays completed.  Works at [employer] on his feet.  He had Planter fasciitis on the right side previously and did stretching and a night splint he has friends that are physical therapist and they have worked him through exercises at home which she is currently doing for the left side.   Interval history He returns today for an injection he has an upcoming hiking trip and would like to have this done prior to his trip.  He also found out that he has 100% coverage surgical benefit plan through his insurance but may have to go to [facility] for surgery and is considering this Objective:  Physical Exam: warm, good capillary refill, no trophic changes or ulcerative lesions, normal DP and PT pulses, and normal sensory exam. Left Foot: Continues to have point tenderness over the medial heel pad, significant extremities equinus noted r.  Radiographs: Multiple views x-ray of the left foot: no fracture, dislocation, swelling or degenerative changes noted, plantar calcaneal spur, and posterior calcaneal spur  IMPRESSION:: IMPRESSION: 1. Mild plantar fasciitis with tiny plantar calcaneal heel spur. 2. Mild flexor digitorum longus tenosynovitis of the level of the tibiotalar joint.     Electronically Signed   By: Neita Garnet M.D.   On: 02/22/2022 22:30 Assessment:   1. Plantar fasciitis of left foot   2. Gastrocnemius equinus of left lower extremity       Plan:  Patient was evaluated and treated and all questions answered.  After sterile prep with povidone-iodine solution and alcohol, the left heel was injected with 0.5cc 2% xylocaine plain, 0.5cc 0.5% marcaine plain, 5mg  triamcinolone acetonide, and 2mg   dexamethasone was injected along the medial plantar fascia at the insertion on the plantar calcaneus. The patient tolerated the procedure well without complication.   Discussed with him that is completely understandable if he would like to proceed with surgical intervention with another provider if it is completely covered in , I likely would do the same thing where I in his position.  He will let me know if he decides and if we need to reschedule delay or cancel his surgery I have no problem with that.  He likely would need to return for some postop care to his provider there for at least 1 or 2 follow-up visits but am happy to help him provide follow-up care here as well although we discussed that this would not be included in the global surgical package and he would be billed for postop visits with me.     Surgical plan:  Procedure: -Left lower extremity EPF and endoscopic gastrocnemius recession  Location: -[facility]  Anesthesia plan: -IV sedation with regional block  Postoperative pain plan: - Tylenol 1000 mg every 6 hours, ibuprofen 600 mg every 8 hours, gabapentin 300 mg every 8 hours x5 days, oxycodone 5 mg 1-2 tabs every 6 hours only as needed  DVT prophylaxis: -ASA 300 mg twice daily  WB Restrictions / DME needs: -WBAT in CAM boot postop  No follow-ups on file.

## 2022-05-08 ENCOUNTER — Other Ambulatory Visit: Payer: Self-pay | Admitting: Podiatry

## 2022-05-08 DIAGNOSIS — M722 Plantar fascial fibromatosis: Secondary | ICD-10-CM

## 2022-05-08 DIAGNOSIS — M21862 Other specified acquired deformities of left lower leg: Secondary | ICD-10-CM

## 2022-05-08 DIAGNOSIS — M216X2 Other acquired deformities of left foot: Secondary | ICD-10-CM | POA: Diagnosis not present

## 2022-05-08 MED ORDER — ACETAMINOPHEN 500 MG PO TABS: 1000.0000 mg | ORAL_TABLET | Freq: Four times a day (QID) | ORAL | 0 refills | Status: AC | PRN

## 2022-05-08 MED ORDER — IBUPROFEN 600 MG PO TABS: 600.0000 mg | ORAL_TABLET | Freq: Four times a day (QID) | ORAL | 0 refills | Status: DC | PRN

## 2022-05-08 MED ORDER — OXYCODONE HCL 5 MG PO TABS: 5.0000 mg | ORAL_TABLET | ORAL | 0 refills | Status: AC | PRN

## 2022-05-08 MED ORDER — GABAPENTIN 300 MG PO CAPS: 300.0000 mg | ORAL_CAPSULE | Freq: Three times a day (TID) | ORAL | 0 refills | Status: AC

## 2022-05-08 MED ORDER — ONDANSETRON HCL 4 MG PO TABS: 4.0000 mg | ORAL_TABLET | Freq: Three times a day (TID) | ORAL | 0 refills | Status: DC | PRN

## 2022-05-08 NOTE — Progress Notes (Signed)
6/30 EPF and gastroc L

## 2022-05-15 ENCOUNTER — Ambulatory Visit (INDEPENDENT_AMBULATORY_CARE_PROVIDER_SITE_OTHER): Payer: No Typology Code available for payment source | Admitting: Podiatry

## 2022-05-15 ENCOUNTER — Ambulatory Visit (INDEPENDENT_AMBULATORY_CARE_PROVIDER_SITE_OTHER): Payer: No Typology Code available for payment source

## 2022-05-15 DIAGNOSIS — M21862 Other specified acquired deformities of left lower leg: Secondary | ICD-10-CM

## 2022-05-15 DIAGNOSIS — M722 Plantar fascial fibromatosis: Secondary | ICD-10-CM

## 2022-05-20 NOTE — Progress Notes (Signed)
  Subjective:  Patient ID: Roger Thompson, male    DOB: [birth year],  MRN: [id number]  Chief Complaint  Patient presents with   Routine Post Op    POV #1 DOS 05/08/2022 PLANTAR FASCITTIS RELEASE & CALF MUSCLE LENGTHENING LT/DR MCDONALD PT    DOS: 05/08/2022 Procedure: Plantar fascial release as well as gastrocnemius with release endoscopic  36 y.o. male returns for post-op check.  Patient states that he is doing okay.  Pain is controlled.  He has been wearing his boot.  Bandages clean dry and intact.  No acute complaints  Review of Systems: Negative except as noted in the HPI. Denies N/V/F/Ch.  Past Medical History:  Diagnosis Date   Kidney stones     Current Outpatient Medications:    acetaminophen (TYLENOL) 500 MG tablet, Take 2 tablets (1,000 mg total) by mouth every 6 (six) hours as needed for up to 14 days (pain)., Disp: 112 tablet, Rfl: 0   diphenhydrAMINE (BENADRYL) 25 mg capsule, Take 25 mg by mouth every 6 (six) hours as needed for allergies., Disp: , Rfl:    gabapentin (NEURONTIN) 300 MG capsule, Take 1 capsule (300 mg total) by mouth 3 (three) times daily for 7 days., Disp: 21 capsule, Rfl: 0   levocetirizine (XYZAL) 5 MG tablet, Take 5 mg by mouth daily as needed for allergies. , Disp: , Rfl:    meloxicam (MOBIC) 15 MG tablet, TAKE 1 TABLET (15 MG TOTAL) BY MOUTH DAILY., Disp: 30 tablet, Rfl: 2   ondansetron (ZOFRAN ODT) 4 MG disintegrating tablet, Take 1 tablet (4 mg total) by mouth every 8 (eight) hours as needed for nausea or vomiting. (Patient not taking: Reported on 02/26/2020), Disp: 10 tablet, Rfl: 0   ondansetron (ZOFRAN) 4 MG tablet, Take 1 tablet (4 mg total) by mouth every 8 (eight) hours as needed for nausea or vomiting., Disp: 20 tablet, Rfl: 0   predniSONE (DELTASONE) 20 MG tablet, Take 2 tablets (40 mg total) by mouth daily with breakfast., Disp: 10 tablet, Rfl: 0   promethazine (PHENERGAN) 25 MG tablet, Take 25 mg by mouth every 6 (six) hours as needed for nausea  or vomiting., Disp: , Rfl:   Social History   Tobacco Use  Smoking Status Never  Smokeless Tobacco Never    No Known Allergies Objective:  There were no vitals filed for this visit. There is no height or weight on file to calculate BMI. Constitutional Well developed. Well nourished.  Vascular Foot warm and well perfused. Capillary refill normal to all digits.   Neurologic Normal speech. Oriented to person, place, and time. Epicritic sensation to light touch grossly present bilaterally.  Dermatologic Skin healing well without signs of infection. Skin edges well coapted without signs of infection.  Orthopedic: Tenderness to palpation noted about the surgical site.   Radiographs: None Assessment:   1. Plantar fasciitis of left foot   2. Gastrocnemius equinus of left lower extremity    Plan:  Patient was evaluated and treated and all questions answered.  S/p foot surgery left -Progressing as expected post-operatively. -XR: None -WB Status: Weightbearing as tolerated with Cam boot left -Sutures: Intact.  No clinical signs of Deis is no complication noted. -Medications: None -Foot redressed.  No follow-ups on file.

## 2022-05-28 ENCOUNTER — Encounter: Payer: Self-pay | Admitting: Podiatry

## 2022-05-28 ENCOUNTER — Ambulatory Visit (INDEPENDENT_AMBULATORY_CARE_PROVIDER_SITE_OTHER): Payer: No Typology Code available for payment source | Admitting: Podiatry

## 2022-05-28 DIAGNOSIS — R2689 Other abnormalities of gait and mobility: Secondary | ICD-10-CM

## 2022-05-28 DIAGNOSIS — M722 Plantar fascial fibromatosis: Secondary | ICD-10-CM

## 2022-05-28 DIAGNOSIS — M21862 Other specified acquired deformities of left lower leg: Secondary | ICD-10-CM

## 2022-05-28 NOTE — Progress Notes (Signed)
  Subjective:  Patient ID: Roger Thompson, male    DOB: November 20, 1985,  MRN: [id number]  Chief Complaint  Patient presents with   Routine Post Op      POV #2 DOS 05/08/2022 PLANTAR FASCITTIS RELEASE & CALF MUSCLE LENGTHENING LT    36 y.o. male returns for post-op check.  Overall doing well not having much pain mostly in the calf  Review of Systems: Negative except as noted in the HPI. Denies N/V/F/Ch.   Objective:  There were no vitals filed for this visit. There is no height or weight on file to calculate BMI. Constitutional Well developed. Well nourished.  Vascular Foot warm and well perfused. Capillary refill normal to all digits.  Calf is soft and supple, no posterior calf or knee pain, negative Homans' sign  Neurologic Normal speech. Oriented to person, place, and time. Epicritic sensation to light touch grossly present bilaterally.  Dermatologic Skin healing well without signs of infection. Skin edges well coapted without signs of infection.  Orthopedic: There is mild tenderness to palpation noted about the surgical site.    Assessment:   1. Inability to bear weight    Plan:  Patient was evaluated and treated and all questions answered.  Doing well all sutures removed today.  He may resume regular bathing.  Continue WBAT in the cam boot.  He will return in work next week.  I did give him a prescription for a rolling knee scooter to use due to the amount of time he will need to be on his feet at work he may be better off with the knee scooter to offload the area.  He will continue his home physical therapy plan.  I will see him back in 3 weeks and plan to return to regular shoe gear and activity at that point.  Return in about 3 weeks (around 06/18/2022) for post op (no x-rays).

## 2022-05-28 NOTE — Patient Instructions (Signed)
Begin this twice a day   EXERCISES- RANGE OF MOTION (ROM) AND STRETCHING EXERCISES - Plantar Fasciitis (Heel Spur Syndrome) These exercises may help you when beginning to rehabilitate your injury. Your symptoms may resolve with or without further involvement from your physician, physical therapist or athletic trainer. While completing these exercises, remember:  Restoring tissue flexibility helps normal motion to return to the joints. This allows healthier, less painful movement and activity. An effective stretch should be held for at least 30 seconds. A stretch should never be painful. You should only feel a gentle lengthening or release in the stretched tissue.  RANGE OF MOTION - Toe Extension, Flexion Sit with your right / left leg crossed over your opposite knee. Grasp your toes and gently pull them back toward the top of your foot. You should feel a stretch on the bottom of your toes and/or foot. Hold this stretch for 10 seconds. Now, gently pull your toes toward the bottom of your foot. You should feel a stretch on the top of your toes and or foot. Hold this stretch for 10 seconds. Repeat  times. Complete this stretch 3 times per day.   RANGE OF MOTION - Ankle Dorsiflexion, Active Assisted Remove shoes and sit on a chair that is preferably not on a carpeted surface. Place right / left foot under knee. Extend your opposite leg for support. Keeping your heel down, slide your right / left foot back toward the chair until you feel a stretch at your ankle or calf. If you do not feel a stretch, slide your bottom forward to the edge of the chair, while still keeping your heel down. Hold this stretch for 10 seconds. Repeat 3 times. Complete this stretch 2 times per day.   STRETCH  Gastroc, Standing Place hands on wall. Extend right / left leg, keeping the front knee somewhat bent. Slightly point your toes inward on your back foot. Keeping your right / left heel on the floor and your knee  straight, shift your weight toward the wall, not allowing your back to arch. You should feel a gentle stretch in the right / left calf. Hold this position for 10 seconds. Repeat 3 times. Complete this stretch 2 times per day.  STRETCH  Soleus, Standing Place hands on wall. Extend right / left leg, keeping the other knee somewhat bent. Slightly point your toes inward on your back foot. Keep your right / left heel on the floor, bend your back knee, and slightly shift your weight over the back leg so that you feel a gentle stretch deep in your back calf. Hold this position for 10 seconds. Repeat 3 times. Complete this stretch 2 times per day.  STRETCH  Gastrocsoleus, Standing  Note: This exercise can place a lot of stress on your foot and ankle. Please complete this exercise only if specifically instructed by your caregiver.  Place the ball of your right / left foot on a step, keeping your other foot firmly on the same step. Hold on to the wall or a rail for balance. Slowly lift your other foot, allowing your body weight to press your heel down over the edge of the step. You should feel a stretch in your right / left calf. Hold this position for 10 seconds. Repeat this exercise with a slight bend in your right / left knee. Repeat 3 times. Complete this stretch 2 times per day.   STRENGTHENING EXERCISES - Plantar Fasciitis (Heel Spur Syndrome)  These exercises may help  you when beginning to rehabilitate your injury. They may resolve your symptoms with or without further involvement from your physician, physical therapist or athletic trainer. While completing these exercises, remember:  Muscles can gain both the endurance and the strength needed for everyday activities through controlled exercises. Complete these exercises as instructed by your physician, physical therapist or athletic trainer. Progress the resistance and repetitions only as guided.  STRENGTH - Towel Curls Sit in a chair  positioned on a non-carpeted surface. Place your foot on a towel, keeping your heel on the floor. Pull the towel toward your heel by only curling your toes. Keep your heel on the floor. Repeat 3 times. Complete this exercise 2 times per day.  STRENGTH - Ankle Inversion Secure one end of a rubber exercise band/tubing to a fixed object (table, pole). Loop the other end around your foot just before your toes. Place your fists between your knees. This will focus your strengthening at your ankle. Slowly, pull your big toe up and in, making sure the band/tubing is positioned to resist the entire motion. Hold this position for 10 seconds. Have your muscles resist the band/tubing as it slowly pulls your foot back to the starting position. Repeat 3 times. Complete this exercises 2 times per day.  Document Released: 10/26/2005 Document Revised: 01/18/2012 Document Reviewed: 02/07/2009 [facility] Patient Information 2014 [place], [place].

## 2022-06-03 ENCOUNTER — Telehealth: Payer: Self-pay

## 2022-06-04 NOTE — Telephone Encounter (Signed)
Attempted to contact the patient but number is not in service.

## 2022-06-18 ENCOUNTER — Ambulatory Visit (INDEPENDENT_AMBULATORY_CARE_PROVIDER_SITE_OTHER): Payer: No Typology Code available for payment source | Admitting: Podiatry

## 2022-06-18 DIAGNOSIS — M722 Plantar fascial fibromatosis: Secondary | ICD-10-CM

## 2022-06-18 DIAGNOSIS — M21862 Other specified acquired deformities of left lower leg: Secondary | ICD-10-CM

## 2022-06-18 NOTE — Progress Notes (Signed)
  Subjective:  Patient ID: Roger Thompson, male    DOB: 1986-02-16,  MRN: [id number]  Chief Complaint  Patient presents with   Routine Post Op      POV #3 DOS 05/08/2022 PLANTAR FASCITTIS RELEASE & CALF MUSCLE LENGTHENING LT    36 y.o. male returns for post-op check.  Overall doing well   Review of Systems: Negative except as noted in the HPI. Denies N/V/F/Ch.   Objective:  There were no vitals filed for this visit. There is no height or weight on file to calculate BMI. Constitutional Well developed. Well nourished.  Vascular Foot warm and well perfused. Capillary refill normal to all digits.  Calf is soft and supple, no posterior calf or knee pain, negative Homans' sign  Neurologic Normal speech. Oriented to person, place, and time. Epicritic sensation to light touch grossly present bilaterally.  Dermatologic Incision is well-healed there is some thickness of the scar laterally on the heel  Orthopedic: No pain on the heel of the gastrocnemius site, there is some pain on the lateral scar of the EPF site, no neurologic deficits    Assessment:   1. Plantar fasciitis of left foot   2. Gastrocnemius equinus of left lower extremity    Plan:  Patient was evaluated and treated and all questions answered.  Doing very well he may resume regular shoe gear and activity gradually.  May begin low impact exercise and weight training, discussed with him that I would avoid impact exercise until 3 months after surgery, short distance hiking is okay at this point but would avoid any long distance hikes until he is closer to 100%.  He does have some tenderness on the lateral incision, expect this is scar related and will resolve with time advised him to massage it directly.  Could consider injection in this area if it does not improve.  He will return to see me as needed  No follow-ups on file.

## 2022-09-15 DIAGNOSIS — R109 Unspecified abdominal pain: Secondary | ICD-10-CM | POA: Diagnosis present

## 2022-09-15 DIAGNOSIS — N23 Unspecified renal colic: Secondary | ICD-10-CM | POA: Insufficient documentation

## 2022-09-16 ENCOUNTER — Emergency Department (HOSPITAL_COMMUNITY)
Admission: EM | Admit: 2022-09-16 | Discharge: 2022-09-16 | Disposition: A | Discharge status: Home / Self Care | Payer: No Typology Code available for payment source | Attending: Emergency Medicine | Admitting: Emergency Medicine

## 2022-09-16 ENCOUNTER — Encounter (HOSPITAL_COMMUNITY): Payer: Self-pay | Admitting: Emergency Medicine

## 2022-09-16 ENCOUNTER — Other Ambulatory Visit: Payer: Self-pay

## 2022-09-16 DIAGNOSIS — N23 Unspecified renal colic: Secondary | ICD-10-CM

## 2022-09-16 LAB — BASIC METABOLIC PANEL
Anion gap: 7 (ref 5–15)
BUN: 11 mg/dL (ref 6–20)
CO2: 24 mmol/L (ref 22–32)
Calcium: 9.1 mg/dL (ref 8.9–10.3)
Chloride: 106 mmol/L (ref 98–111)
Creatinine, Ser: 1.09 mg/dL (ref 0.61–1.24)
GFR, Estimated: >60 mL/min (ref >60)
Glucose, Bld: 101 mg/dL — ABNORMAL HIGH (ref 70–99)
Potassium: 3.9 mmol/L (ref 3.5–5.1)
Sodium: 137 mmol/L (ref 135–145)

## 2022-09-16 LAB — CBC WITH DIFFERENTIAL/PLATELET
Abs Immature Granulocytes: 0.03 10*3/uL (ref 0.00–0.07)
Basophils Absolute: 0.1 10*3/uL (ref 0.0–0.1)
Basophils Relative: 1 %
Eosinophils Absolute: 0.4 10*3/uL (ref 0.0–0.5)
Eosinophils Relative: 4 %
HCT: 44.9 % (ref 39.0–52.0)
Hemoglobin: 16 g/dL (ref 13.0–17.0)
Immature Granulocytes: 0 %
Lymphocytes Relative: 36 %
Lymphs Abs: 3.1 10*3/uL (ref 0.7–4.0)
MCH: 31.1 pg (ref 26.0–34.0)
MCHC: 35.6 g/dL (ref 30.0–36.0)
MCV: 87.4 fL (ref 80.0–100.0)
Monocytes Absolute: 0.9 10*3/uL (ref 0.1–1.0)
Monocytes Relative: 11 %
Neutro Abs: 4 10*3/uL (ref 1.7–7.7)
Neutrophils Relative %: 48 %
Platelets: 244 10*3/uL (ref 150–400)
RBC: 5.14 MIL/uL (ref 4.22–5.81)
RDW: 11.9 % (ref 11.5–15.5)
WBC: 8.5 10*3/uL (ref 4.0–10.5)
nRBC: 0 % (ref 0.0–0.2)

## 2022-09-16 LAB — URINALYSIS, ROUTINE W REFLEX MICROSCOPIC
Bacteria, UA: NONE SEEN
Bilirubin Urine: NEGATIVE
Glucose, UA: NEGATIVE mg/dL
Ketones, ur: NEGATIVE mg/dL
Leukocytes,Ua: NEGATIVE
Nitrite: NEGATIVE
Protein, ur: 30 mg/dL — AB
RBC / HPF: >50 RBC/hpf — ABNORMAL HIGH (ref 0–5)
Specific Gravity, Urine: 1.023 (ref 1.005–1.030)
pH: 6 (ref 5.0–8.0)

## 2022-09-16 MED ORDER — TAMSULOSIN HCL 0.4 MG PO CAPS: 0.4000 mg | ORAL_CAPSULE | Freq: Every day | ORAL | 0 refills | Status: AC

## 2022-09-16 MED ORDER — ONDANSETRON HCL 4 MG/2ML IJ SOLN
4.0000 mg | Freq: Once | INTRAMUSCULAR | Status: AC
  Administered 2022-09-16: 4 mg via INTRAVENOUS
  Filled 2022-09-16: qty 2

## 2022-09-16 MED ORDER — ONDANSETRON 4 MG PO TBDP: ORAL_TABLET | ORAL | 0 refills | Status: AC

## 2022-09-16 MED ORDER — HYDROMORPHONE HCL 1 MG/ML IJ SOLN
1.0000 mg | Freq: Once | INTRAMUSCULAR | Status: AC
  Administered 2022-09-16: 1 mg via INTRAVENOUS
  Filled 2022-09-16: qty 1

## 2022-09-16 MED ORDER — OXYCODONE-ACETAMINOPHEN 5-325 MG PO TABS: 1.0000 | ORAL_TABLET | ORAL | 0 refills | Status: AC | PRN

## 2022-09-16 MED ORDER — OXYCODONE-ACETAMINOPHEN 5-325 MG PO TABS: 1.0000 | ORAL_TABLET | Freq: Four times a day (QID) | ORAL | 0 refills | Status: AC | PRN

## 2022-09-16 MED ORDER — KETOROLAC TROMETHAMINE 30 MG/ML IJ SOLN
15.0000 mg | Freq: Once | INTRAMUSCULAR | Status: AC
  Administered 2022-09-16: 15 mg via INTRAVENOUS
  Filled 2022-09-16: qty 1

## 2022-09-16 NOTE — ED Notes (Signed)
ED Provider at bedside. 

## 2022-09-16 NOTE — ED Provider Notes (Signed)
[facility] EMERGENCY DEPARTMENT Provider Note   CSN: [id number] Arrival date & time: 09/15/22  2349     History  Chief Complaint  Patient presents with   Flank Pain    left    Roger Thompson is a 36 y.o. male.  Patient presents to the emergency department for evaluation of left flank pain.  Patient reports that he has been experiencing severe left sided back pain with some radiation towards the front for 2 hours.  Patient has had nausea and vomiting.  This feels similar to prior kidney stones.       Home Medications Prior to Admission medications   Medication Sig Start Date End Date Taking? Authorizing Provider  ondansetron (ZOFRAN-ODT) 4 MG disintegrating tablet 4mg  ODT q4 hours prn nausea/vomit 09/16/22  Yes Shanavia Makela, 13/8/23, MD  oxyCODONE-acetaminophen (PERCOCET) 5-325 MG tablet Take 1-2 tablets by mouth every 4 (four) hours as needed. 09/16/22  Yes Charelle Petrakis, 13/8/23, MD  oxyCODONE-acetaminophen (PERCOCET/ROXICET) 5-325 MG tablet Take 1 tablet by mouth every 6 (six) hours as needed for severe pain. 09/16/22  Yes Tishina Lown, 13/8/23, MD  tamsulosin (FLOMAX) 0.4 MG CAPS capsule Take 1 capsule (0.4 mg total) by mouth daily. 09/16/22  Yes Fredrick Dray, 13/8/23, MD  diphenhydrAMINE (BENADRYL) 25 mg capsule Take 25 mg by mouth every 6 (six) hours as needed for allergies.    [provider]  gabapentin (NEURONTIN) 300 MG capsule Take 1 capsule (300 mg total) by mouth 3 (three) times daily for 7 days. 05/08/22 05/15/22  McDonald, 07/16/22, DPM  levocetirizine (XYZAL) 5 MG tablet Take 5 mg by mouth daily as needed for allergies.     [provider]  meloxicam (MOBIC) 15 MG tablet TAKE 1 TABLET (15 MG TOTAL) BY MOUTH DAILY. 03/16/22   McDonald, 05/16/22, DPM  promethazine (PHENERGAN) 25 MG tablet Take 25 mg by mouth every 6 (six) hours as needed for nausea or vomiting.    [provider]      Allergies    Patient has no known allergies.    Review of  Systems   Review of Systems  Physical Exam Updated Vital Signs BP 115/83   Pulse 70   Temp 97.9 F (36.6 C) (Oral)   Resp 14   Ht 6\' 2"  (1.88 m)   Wt 112 kg   SpO2 99%   BMI 31.71 kg/m  Physical Exam Vitals and nursing note reviewed.  Constitutional:      General: He is not in acute distress.    Appearance: He is well-developed.  HENT:     Head: Normocephalic and atraumatic.     Mouth/Throat:     Mouth: Mucous membranes are moist.  Eyes:     General: Vision grossly intact. Gaze aligned appropriately.     Extraocular Movements: Extraocular movements intact.     Conjunctiva/sclera: Conjunctivae normal.  Cardiovascular:     Rate and Rhythm: Normal rate and regular rhythm.     Pulses: Normal pulses.     Heart sounds: Normal heart sounds, S1 normal and S2 normal. No murmur heard.    No friction rub. No gallop.  Pulmonary:     Effort: Pulmonary effort is normal. No respiratory distress.     Breath sounds: Normal breath sounds.  Abdominal:     Palpations: Abdomen is soft.     Tenderness: There is no abdominal tenderness. There is no guarding or rebound.     Hernia: No hernia is present.  Musculoskeletal:  General: No swelling.     Cervical back: Full passive range of motion without pain, normal range of motion and neck supple. No pain with movement, spinous process tenderness or muscular tenderness. Normal range of motion.     Right lower leg: No edema.     Left lower leg: No edema.  Skin:    General: Skin is warm and dry.     Capillary Refill: Capillary refill takes less than 2 seconds.     Findings: No ecchymosis, erythema, lesion or wound.  Neurological:     Mental Status: He is alert and oriented to person, place, and time.     GCS: GCS eye subscore is 4. GCS verbal subscore is 5. GCS motor subscore is 6.     Cranial Nerves: Cranial nerves 2-12 are intact.     Sensory: Sensation is intact.     Motor: Motor function is intact. No weakness or abnormal muscle  tone.     Coordination: Coordination is intact.  Psychiatric:        Mood and Affect: Mood normal.        Speech: Speech normal.        Behavior: Behavior normal.     ED Results / Procedures / Treatments   Labs (all labs ordered are listed, but only abnormal results are displayed) Labs Reviewed  BASIC METABOLIC PANEL - Abnormal; Notable for the following components:      Result Value   Glucose, Bld 101 (*)    All other components within normal limits  URINALYSIS, ROUTINE W REFLEX MICROSCOPIC - Abnormal; Notable for the following components:   Hgb urine dipstick LARGE (*)    Protein, ur 30 (*)    RBC / HPF >50 (*)    All other components within normal limits  CBC WITH DIFFERENTIAL/PLATELET    EKG None  Radiology No results found.  Procedures Procedures    Medications Ordered in ED Medications  ketorolac (TORADOL) 30 MG/ML injection 15 mg (has no administration in time range)  HYDROmorphone (DILAUDID) injection 1 mg (1 mg Intravenous Given 09/16/22 0033)  ondansetron (ZOFRAN) injection 4 mg (4 mg Intravenous Given 09/16/22 0033)    ED Course/ Medical Decision Making/ A&P                           Medical Decision Making Amount and/or Complexity of Data Reviewed External Data Reviewed: labs and radiology. Labs: ordered. Decision-making details documented in ED Course. Radiology: ordered and independent interpretation performed. Decision-making details documented in ED Course.  Risk Prescription drug management.   Patient with left flank pain that began 2 hours ago and has been intense, accompanied by nausea and vomiting.  Multiple diagnoses considered including renal colic and pyelonephritis.  Patient has a history of kidney stones and this feels similar.  Presentation is very consistent with renal colic.  Urinalysis with greater than 50 red cells, no signs of infection.  Blood work normal.  Patient feeling much improvement after analgesia.  Discussed risks and  benefits of CT versus empiric treatment for kidney stone.  Patient is comfortable being treated with Flomax, analgesia and follow-up with urology.  He will return to the ER for uncontrolled pain or fever.        Final Clinical Impression(s) / ED Diagnoses Final diagnoses:  Ureteral colic    Rx / DC Orders ED Discharge Orders          Ordered    oxyCODONE-acetaminophen (  PERCOCET/ROXICET) 5-325 MG tablet  Every 6 hours PRN        09/16/22 0311    tamsulosin (FLOMAX) 0.4 MG CAPS capsule  Daily        09/16/22 0313    oxyCODONE-acetaminophen (PERCOCET) 5-325 MG tablet  Every 4 hours PRN        09/16/22 0313    ondansetron (ZOFRAN-ODT) 4 MG disintegrating tablet        09/16/22 0313    Ambulatory referral to Urology        09/16/22 0313              Gilda Crease, MD 09/16/22 [phone]

## 2022-09-16 NOTE — ED Triage Notes (Signed)
C/o of kidney stone related pain last night. Past 2 hours of intense pain on left side with nausea with vomiting 2x. Denies fevers

## 2022-09-17 MED FILL — Oxycodone w/ Acetaminophen Tab 5-325 MG: ORAL | Qty: 6 | Status: AC

## 2022-10-26 ENCOUNTER — Encounter: Payer: Self-pay | Admitting: Urology

## 2022-11-19 IMAGING — MR MR HEEL *L* W/O CM
6 series · 40 of 40 positions shown · non-contrast
Comparison: Left foot radiographs 10/30/2021

CLINICAL DATA: Left foot pain for 1 year. Evaluate for plantar
fasciitis.

EXAM:
MR OF THE LEFT HEEL WITHOUT CONTRAST
TECHNIQUE: Multiplanar, multisequence MR imaging of the left ankle/hindfoot was
performed. No intravenous contrast was administered.

[Series 4: T2 fat-sat · axial · 3.0mm · 0.50mm/px · z∈[-71,+50]mm · 7 of 32 slices shown (1 of 2)]
[im 1/32]
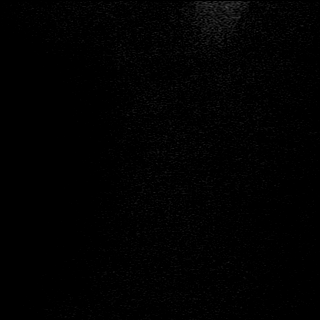
[im 6/32]
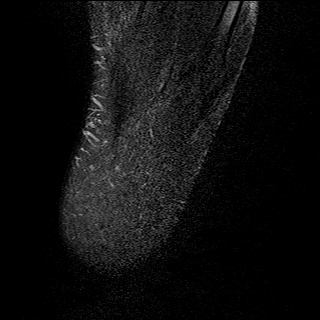
[im 11/32]
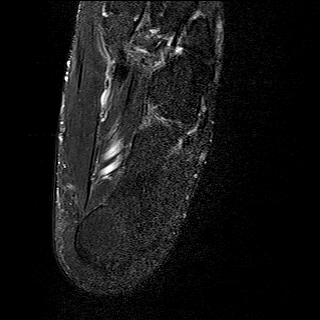
[im 16/32]
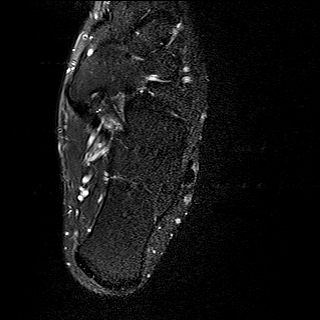
[im 21/32]
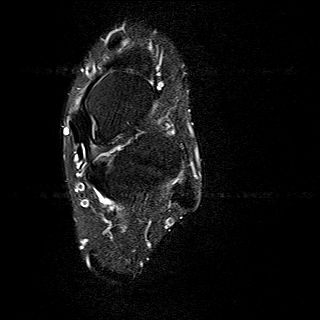
[im 26/32]
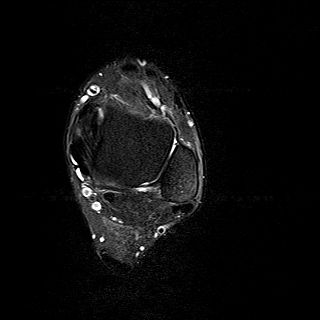
[im 32/32]
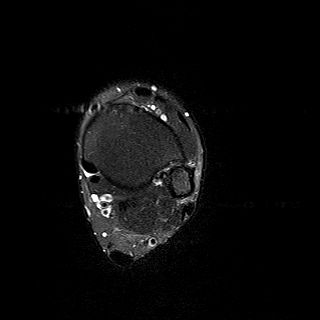

[Series 5: PD fat-sat · axial · 3.0mm · 0.42mm/px · z∈[-71,+50]mm · 7 of 32 slices shown]
[im 1/32]
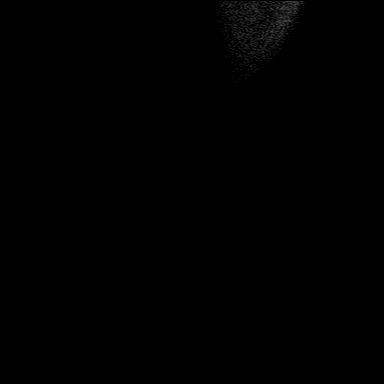
[im 6/32]
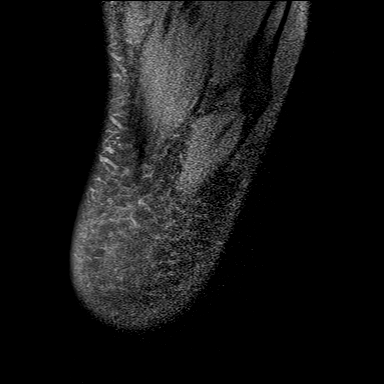
[im 11/32]
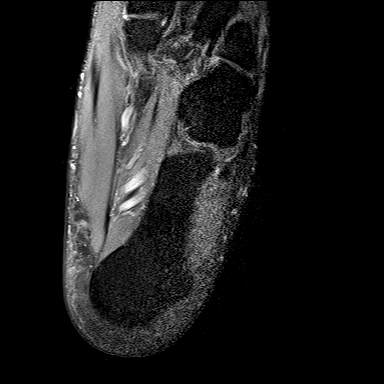
[im 16/32]
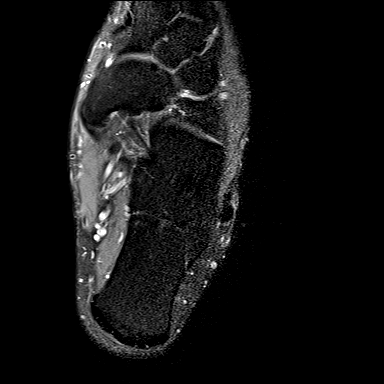
[im 21/32]
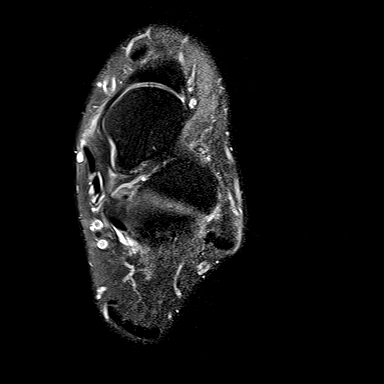
[im 26/32]
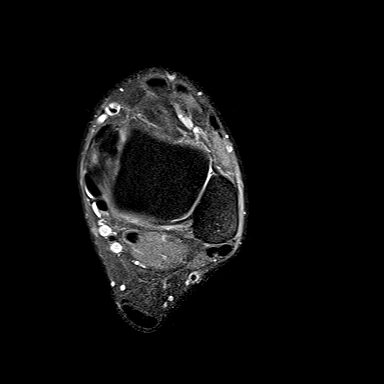
[im 32/32]
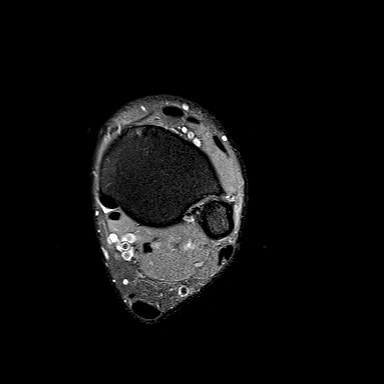

[Series 6: T1 · sagittal · 4.0mm · 0.56mm/px · 4 of 20 slices shown]
[im 1/20]
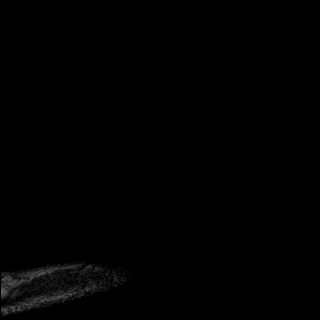
[im 7/20]
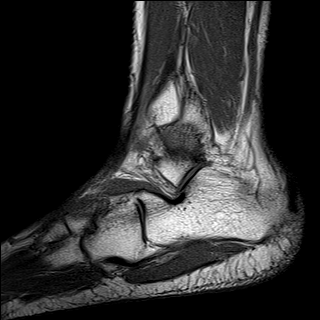
[im 13/20]
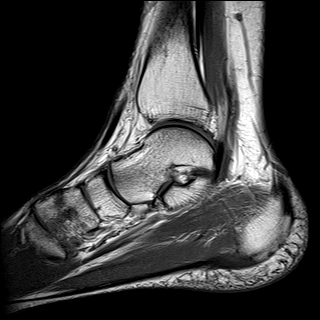
[im 20/20]
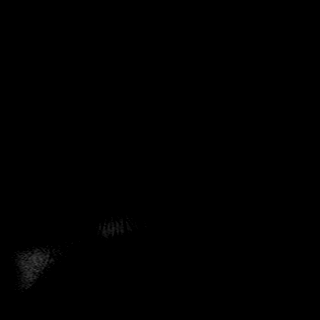

[Series 7: STIR · sagittal · 4.0mm · 0.35mm/px · 4 of 20 slices shown]
[im 1/20]
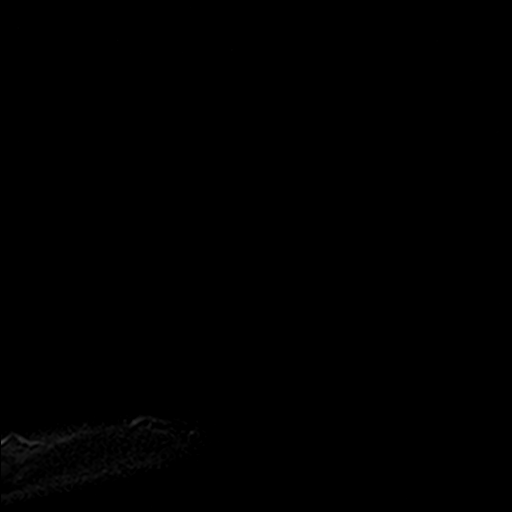
[im 7/20]
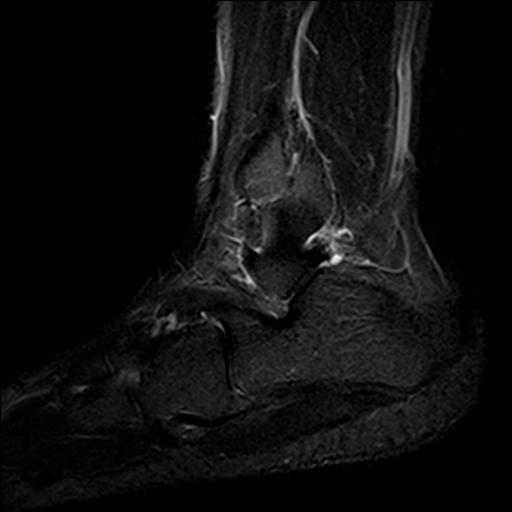
[im 13/20]
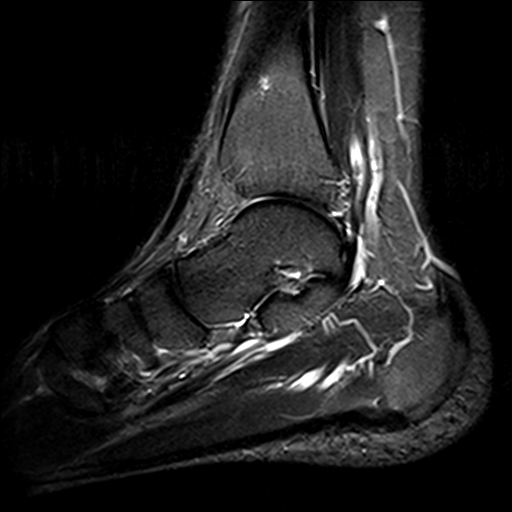
[im 20/20]
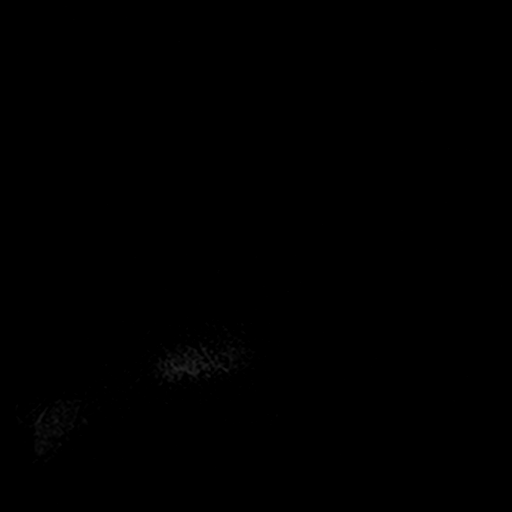

[Series 9: T2 fat-sat · coronal · 3.0mm · 0.62mm/px · 9 of 39 slices shown (2 of 2)]
[im 1/39]
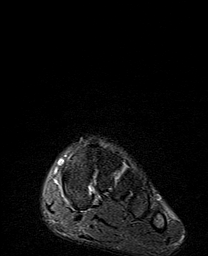
[im 5/39]
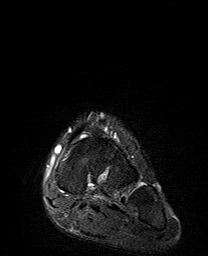
[im 10/39]
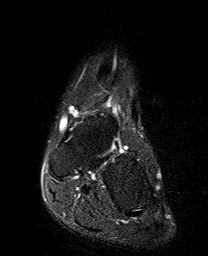
[im 15/39]
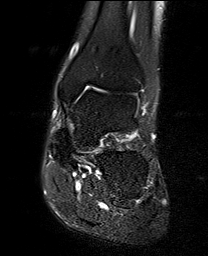
[im 20/39]
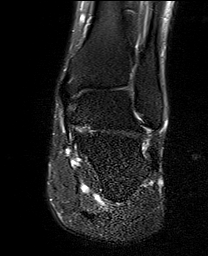
[im 24/39]
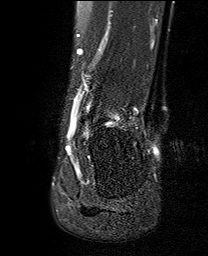
[im 29/39]
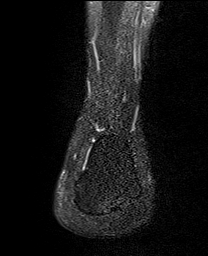
[im 34/39]
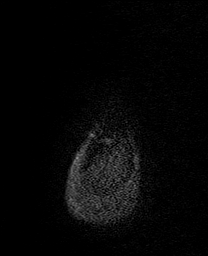
[im 39/39]
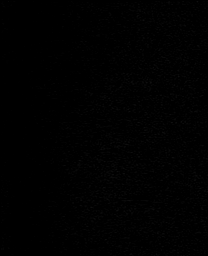

[Series 10: T2 · coronal · 3.0mm · 0.62mm/px · 9 of 39 slices shown]
[im 1/39]
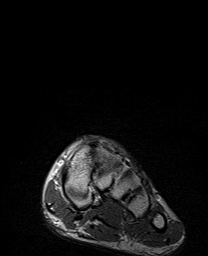
[im 5/39]
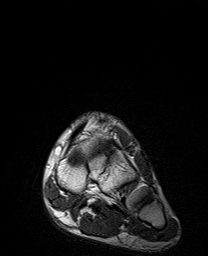
[im 10/39]
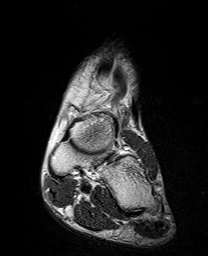
[im 15/39]
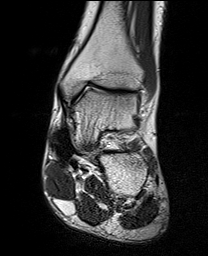
[im 20/39]
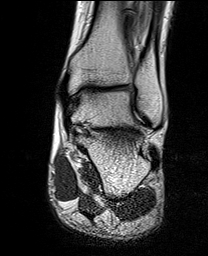
[im 24/39]
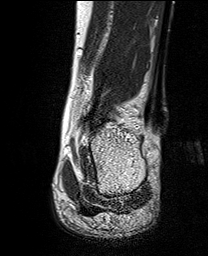
[im 29/39]
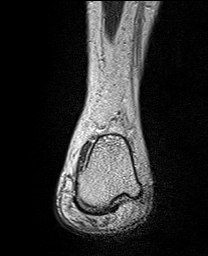
[im 34/39]
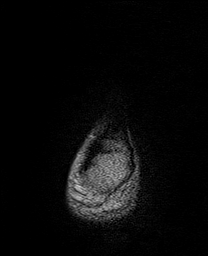
[im 39/39]
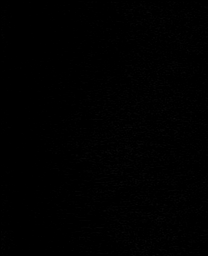

[40 of 40 positions shown; findings below may reference images not displayed]

FINDINGS: TENDONS

Peroneal: The peroneus longus and brevis tendons are intact.

Posteromedial: The posterior tibial tendon is intact. Mild flexor
digitorum longus tenosynovitis at the level of the tibiotalar joint
(axial image 8). The flexor hallucis longus tendon is intact.

Anterior: The tibialis anterior, extensor hallucis longus, and
extensor digitorum longus tendons are intact.

Achilles: Intact.

Plantar Fascia: Small plantar calcaneal heel spur with minimal
associated marrow edema. Minimal intermediate T2 signal within the
medial band of the origin of the plantar fascia (coronal series 9,
image 13). No fluid bright tear or tendon retraction.

LIGAMENTS

Lateral: The anterior and posterior talofibular, anterior and
posterior tibiofibular, and calcaneofibular ligaments are intact.

Medial: The tibiotalar deep deltoid and tibial spring ligaments are
intact.

CARTILAGE

Ankle Joint: Intact cartilage.

Subtalar Joints/Sinus Tarsi: Fat is preserved within sinus tarsi.

Bones: Minimal talonavicular cartilage thinning and dorsal
degenerative spurring.

Other: The tarsal tunnel is unremarkable. The Lisfranc ligament
complex is intact.
IMPRESSION: :
IMPRESSION: 1. Mild plantar fasciitis with tiny plantar calcaneal heel spur.
2. Mild flexor digitorum longus tenosynovitis of the level of the
tibiotalar joint.
